# Patient Record
Sex: Female | Born: 1995 | Race: Black or African American | Hispanic: No | Marital: Single | Smoking: Never smoker
Health system: Southern US, Community
[De-identification: ages and names within clinical notes are randomized; demographics above are authoritative.]

## PROBLEM LIST (undated history)

## (undated) DIAGNOSIS — D649 Anemia, unspecified: Secondary | ICD-10-CM

## (undated) HISTORY — PX: NO PAST SURGERIES: SHX2092

---
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
## 2013-05-10 ENCOUNTER — Encounter: Payer: Self-pay | Admitting: Family Medicine

## 2013-05-10 ENCOUNTER — Other Ambulatory Visit (HOSPITAL_COMMUNITY)
Admission: RE | Admit: 2013-05-10 | Discharge: 2013-05-10 | Disposition: A | Discharge status: Home / Self Care | Payer: Medicaid Other | Source: Ambulatory Visit | Attending: Family Medicine | Admitting: Family Medicine

## 2013-05-10 ENCOUNTER — Ambulatory Visit (INDEPENDENT_AMBULATORY_CARE_PROVIDER_SITE_OTHER): Payer: Medicaid Other | Admitting: Family Medicine

## 2013-05-10 VITALS — BP 119/74 | HR 66 | Ht 67.0 in | Wt 110.0 lb

## 2013-05-10 DIAGNOSIS — Z0000 Encounter for general adult medical examination without abnormal findings: Secondary | ICD-10-CM

## 2013-05-10 DIAGNOSIS — Z309 Encounter for contraceptive management, unspecified: Secondary | ICD-10-CM

## 2013-05-10 DIAGNOSIS — IMO0001 Reserved for inherently not codable concepts without codable children: Secondary | ICD-10-CM

## 2013-05-10 DIAGNOSIS — Z113 Encounter for screening for infections with a predominantly sexual mode of transmission: Secondary | ICD-10-CM | POA: Insufficient documentation

## 2013-05-10 DIAGNOSIS — D509 Iron deficiency anemia, unspecified: Secondary | ICD-10-CM

## 2013-05-10 DIAGNOSIS — N72 Inflammatory disease of cervix uteri: Secondary | ICD-10-CM

## 2013-05-10 DIAGNOSIS — D649 Anemia, unspecified: Secondary | ICD-10-CM

## 2013-05-10 LAB — CBC
HCT: 41.9 % (ref 36.0–46.0)
MCV: 87.1 fL (ref 78.0–100.0)
RBC: 4.81 MIL/uL (ref 3.87–5.11)
RDW: 12.9 % (ref 11.5–15.5)
WBC: 4.3 10*3/uL (ref 4.0–10.5)

## 2013-05-10 NOTE — Progress Notes (Signed)
  Subjective:    Patient ID: Anne Vaughn, female    DOB: 1996-04-20, 17 y.o.   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  No past surgical history on file.     Review of Systems Intermittent vaginal discharge     Objective:   Physical Exam  Constitutional: She is oriented to person, place, and time. She appears well-developed and well-nourished. No distress.  HENT:  Head: Normocephalic and atraumatic.  Mouth/Throat: Oropharynx is clear and moist.  Eyes: Pupils are equal, round, and reactive to light.  Cardiovascular: Normal rate and regular rhythm.   Pulmonary/Chest: Effort normal.  Abdominal: Soft. Bowel sounds are normal.  Neurological: She is alert and oriented to person, place, and time.  Skin: Skin is warm and dry. She is not diaphoretic.  Psychiatric: She has a normal mood and affect. Her behavior is normal.    BP 119/74  Pulse 66  Ht 5\' 7"  (1.702 m)  Wt 110 lb (49.896 kg)  BMI 17.22 kg/m2  LMP 07/11/2012       Assessment & Plan:

## 2013-05-10 NOTE — Patient Instructions (Addendum)
Dear Anne Vaughn,   Congratulations on graduating. Please read below about the topics.   1. Anemia - I will check your blood today to see if you still have anemia  2. Birth Control - We will check a urine pregnancy test today. If it is negative, then you can take the pills. Use condoms until that time.   3. Infection Testing - Please follow up next week to go over the results.   I will see you next week.   Sincerely,   Dr. Clinton Sawyer

## 2013-05-11 DIAGNOSIS — Z0000 Encounter for general adult medical examination without abnormal findings: Secondary | ICD-10-CM | POA: Insufficient documentation

## 2013-05-11 NOTE — Assessment & Plan Note (Addendum)
Counseled about contraception. I think she would be better suited for IUD or Nexplanon, but she prefers pills. No history of clots. Will check pregnancy test today. If negative, then will start pills at next visit. Told to use condoms.

## 2013-05-11 NOTE — Assessment & Plan Note (Signed)
Given age and hx of sex with 5 partner, need to check for gonorrhea and chlamydia and HIV. Will get second HPV vaccine today. F/u in 1 week to go over results.

## 2013-05-11 NOTE — Assessment & Plan Note (Signed)
Not currently taking Fe. Will check CBC before re-initiating med.

## 2013-05-14 ENCOUNTER — Ambulatory Visit (INDEPENDENT_AMBULATORY_CARE_PROVIDER_SITE_OTHER): Payer: Medicaid Other | Admitting: Family Medicine

## 2013-05-14 ENCOUNTER — Telehealth: Payer: Self-pay | Admitting: Family Medicine

## 2013-05-14 ENCOUNTER — Encounter: Payer: Self-pay | Admitting: Family Medicine

## 2013-05-14 VITALS — BP 132/87 | HR 97 | Temp 99.0°F | Ht 67.0 in | Wt 109.2 lb

## 2013-05-14 DIAGNOSIS — L738 Other specified follicular disorders: Secondary | ICD-10-CM

## 2013-05-14 DIAGNOSIS — Z309 Encounter for contraceptive management, unspecified: Secondary | ICD-10-CM

## 2013-05-14 DIAGNOSIS — L853 Xerosis cutis: Secondary | ICD-10-CM

## 2013-05-14 DIAGNOSIS — Z3009 Encounter for other general counseling and advice on contraception: Secondary | ICD-10-CM

## 2013-05-14 DIAGNOSIS — Z23 Encounter for immunization: Secondary | ICD-10-CM

## 2013-05-14 DIAGNOSIS — Z00129 Encounter for routine child health examination without abnormal findings: Secondary | ICD-10-CM

## 2013-05-14 MED ORDER — NORGESTIMATE-ETH ESTRADIOL 0.25-35 MG-MCG PO TABS: 1.0000 | ORAL_TABLET | Freq: Every day | ORAL | Status: DC

## 2013-05-14 NOTE — Progress Notes (Signed)
  Subjective:    Patient ID: Anne Vaughn, female    DOB: December 19, 1995, 17 y.o.   MRN: [id number]  HPI  17 year old F who presents for contraception counseling and follow up of STD testing. She is also complaining of right foot itching  Contraception Counseling: Urine pregnancy test on 05/10/13 negative, and pt denies intercourse since that time; is still interested in OCP and not nexplanon or IUD; patient denies history of blood clot; non smoker   Right Foot Itching:  Location: right lateral foot Duration: weeks Alleviating: nothing, uses vaseline for moisturizer without improvement  Exacerbating: she thinks heat makes it worse Associated Symptoms: peeling of skin  Denies: redness, swelling,   Review of Systems     Objective:   Physical Exam BP 132/87  Pulse 97  Temp(Src) 99 F (37.2 C) (Oral)  Ht 5\' 7"  (1.702 m)  Wt 109 lb 3.2 oz (49.533 kg)  BMI 17.1 kg/m2  LMP 07/11/2012  Gen: non distressed, thin teenage female Skin: mild scaling of right plantar surface without rash      Assessment & Plan:

## 2013-05-14 NOTE — Telephone Encounter (Signed)
I left a message with the patient's mother stating that her test results were all normal.

## 2013-05-14 NOTE — Patient Instructions (Addendum)
Dear Anne Vaughn,   Thanks for coming in today.   Please use hydrocortisone cream on the right foot twice a day for 2 weeks. Continue to moisturize as well.   Regarding the birth control, start taking it today. Take it at the same time each day. Use condoms for the next 7 days until it become effective.   Sincerely,   Dr. Karilyn Cota Estradiol; Norgestimate tablets What is this medicine? ETHINYL ESTRADIOL; NORGESTIMATE (ETH in il es tra DYE ole; nor JES ti mate) is an oral contraceptive. The products combine two types of female hormones, an estrogen and a progestin. They are used to prevent ovulation and pregnancy. Some products are also used to treat acne in females. This medicine may be used for other purposes; ask your health care provider or pharmacist if you have questions. What should I tell my health care provider before I take this medicine? They need to know if you have or ever had any of these conditions: -abnormal vaginal bleeding -blood vessel disease or blood clots -breast, cervical, endometrial, ovarian, liver, or uterine cancer -diabetes -gallbladder disease -heart disease or recent heart attack -high blood pressure -high cholesterol -kidney disease -liver disease -migraine headaches -stroke -systemic lupus erythematosus (SLE) -tobacco smoker -an unusual or allergic reaction to estrogens, progestins, other medicines, foods, dyes, or preservatives -pregnant or trying to get pregnant -breast-feeding How should I use this medicine? Take this medicine by mouth. To reduce nausea, this medicine may be taken with food. Follow the directions on the prescription label. Take this medicine at the same time each day and in the order directed on the package. Do not take your medicine more often than directed. Contact your pediatrician regarding the use of this medicine in children. Special care may be needed. This medicine has been used in female children who have  started having menstrual periods. A patient package insert for the product will be given with each prescription and refill. Read this sheet carefully each time. The sheet may change frequently. Overdosage: If you think you have taken too much of this medicine contact a poison control center or emergency room at once. NOTE: This medicine is only for you. Do not share this medicine with others. What if I miss a dose? If you miss a dose, refer to the patient information sheet you received with your medicine for direction. If you miss more than one pill, this medicine may not be as effective and you may need to use another form of birth control. What may interact with this medicine? -acetaminophen -antibiotics or medicines for infections, especially rifampin, rifabutin, rifapentine, and griseofulvin, and possibly penicillins or tetracyclines -aprepitant -ascorbic acid (vitamin C) -atorvastatin -barbiturate medicines, such as phenobarbital -bosentan -carbamazepine -caffeine -clofibrate -cyclosporine -dantrolene -doxercalciferol -felbamate -grapefruit juice -hydrocortisone -medicines for anxiety or sleeping problems, such as diazepam or temazepam -medicines for diabetes, including pioglitazone -mineral oil -modafinil -mycophenolate -nefazodone -oxcarbazepine -phenytoin -prednisolone -ritonavir or other medicines for HIV infection or AIDS -rosuvastatin -selegiline -soy isoflavones supplements -St. John's wort -tamoxifen or raloxifene -theophylline -thyroid hormones -topiramate -warfarin This list may not describe all possible interactions. Give your health care provider a list of all the medicines, herbs, non-prescription drugs, or dietary supplements you use. Also tell them if you smoke, drink alcohol, or use illegal drugs. Some items may interact with your medicine. What should I watch for while using this medicine? Visit your doctor or health care professional for regular  checks on your progress. You will need a regular  breast and pelvic exam and Pap smear while on this medicine. You should also discuss the need for regular mammograms with your health care professional, and follow his or her guidelines for these tests. This medicine can make your body retain fluid, making your fingers, hands, or ankles swell. Your blood pressure can go up. Contact your doctor or health care professional if you feel you are retaining fluid. Use an additional method of contraception during the first cycle that you take these tablets. If you have any reason to think you are pregnant, stop taking this medicine right away and contact your doctor or health care professional. If you are taking this medicine for hormone related problems, it may take several cycles of use to see improvement in your condition. Smoking increases the risk of getting a blood clot or having a stroke while you are taking birth control pills, especially if you are more than 19 years old. You are strongly advised not to smoke. This medicine can make you more sensitive to the sun. Keep out of the sun. If you cannot avoid being in the sun, wear protective clothing and use sunscreen. Do not use sun lamps or tanning beds/booths. If you wear contact lenses and notice visual changes, or if the lenses begin to feel uncomfortable, consult your eye care specialist. In some women, tenderness, swelling, or minor bleeding of the gums may occur. Notify your dentist if this happens. Brushing and flossing your teeth regularly may help limit this. See your dentist regularly and inform your dentist of the medicines you are taking. If you are going to have elective surgery, you may need to stop taking this medicine before the surgery. Consult your health care professional for advice. This medicine does not protect you against HIV infection (AIDS) or any other sexually transmitted diseases. What side effects may I notice from receiving this  medicine? Side effects that you should report to your doctor or health care professional as soon as possible: -breast tissue changes or discharge -changes in vaginal bleeding during your period or between your periods -chest pain -coughing up blood -dizziness or fainting spells -headaches or migraines -leg, arm or groin pain -severe or sudden headaches -stomach pain (severe) -sudden shortness of breath -sudden loss of coordination, especially on one side of the body -speech problems -symptoms of vaginal infection like itching, irritation or unusual discharge -tenderness in the upper abdomen -vomiting -weakness or numbness in the arms or legs, especially on one side of the body -yellowing of the eyes or skin Side effects that usually do not require medical attention (report to your doctor or health care professional if they continue or are bothersome): -breakthrough bleeding and spotting that continues beyond the 3 initial cycles of pills -breast tenderness -mood changes, anxiety, depression, frustration, anger, or emotional outbursts -increased sensitivity to sun or ultraviolet light -nausea -skin rash, acne, or brown spots on the skin -weight gain (slight) This list may not describe all possible side effects. Call your doctor for medical advice about side effects. You may report side effects to FDA at 1-800-FDA-1088. Where should I keep my medicine? Keep out of the reach of children. Store at room temperature between 15 and 30 degrees C (59 and 86 degrees F). Throw away any unused medicine after the expiration date. NOTE: This sheet is a summary. It may not cover all possible information. If you have questions about this medicine, talk to your doctor, pharmacist, or health care provider.  2013, [employer]. (10/10/2008 1:40:47 PM)

## 2013-05-16 DIAGNOSIS — L853 Xerosis cutis: Secondary | ICD-10-CM | POA: Insufficient documentation

## 2013-05-16 NOTE — Assessment & Plan Note (Signed)
Appears that ithcing of right foot is due to mild inflammatory dermatitis. No evidence of infection. Instructed to use cortisone BID for 14 days. Return PRN.

## 2013-05-16 NOTE — Assessment & Plan Note (Signed)
Given negative pregnancy test and no history of DVT, patient given order for Sprintec. Instructed to use barrier contraception for 7 days and to take pill at same time each day. Information about medicine given in patient handout.

## 2013-09-14 ENCOUNTER — Ambulatory Visit: Payer: Medicaid Other

## 2013-09-28 ENCOUNTER — Encounter: Payer: Self-pay | Admitting: Family Medicine

## 2013-11-21 ENCOUNTER — Ambulatory Visit (INDEPENDENT_AMBULATORY_CARE_PROVIDER_SITE_OTHER): Payer: Medicaid Other | Admitting: Family Medicine

## 2013-11-21 VITALS — BP 130/73 | HR 76 | Wt 115.0 lb

## 2013-11-21 DIAGNOSIS — IMO0001 Reserved for inherently not codable concepts without codable children: Secondary | ICD-10-CM

## 2013-11-21 DIAGNOSIS — Z23 Encounter for immunization: Secondary | ICD-10-CM

## 2013-11-21 DIAGNOSIS — Z309 Encounter for contraceptive management, unspecified: Secondary | ICD-10-CM

## 2013-11-21 LAB — POCT URINE PREGNANCY: PREG TEST UR: NEGATIVE

## 2013-11-21 MED ORDER — NORGESTREL-ETHINYL ESTRADIOL 0.3-30 MG-MCG PO TABS: 1.0000 | ORAL_TABLET | Freq: Every day | ORAL | Status: DC

## 2013-11-21 NOTE — Patient Instructions (Signed)
Dear Anne Vaughn,   It was nice to see you. I sent a prescription to the pharmacy for a new type of birth control. It is very similar to the other but may have less side effects. I do not know the price. If it is too expensive, then we will have to go back to sprintec or think about an IUD or nexplanon.   Take Care,   Dr. Clinton SawyerWilliamson

## 2013-11-21 NOTE — Progress Notes (Signed)
   Subjective:    Patient ID: Anne Vaughn, female    DOB: 10-Nov-1995, 18 y.o.   MRN: [id number]  HPI 18 year old F who presents for contraception mgt. Last seen 05/14/13 and placed on sprintec OCP. She stopped it due to irritability and headache. LMP 10/28/13. Pt does not want nexplanon, IUD or depo-provera injection.    She would also like her 3rd shot in HPV series.   PMH - no hx DVT, PE  Review of Systems     Objective:   Physical Exam BP 130/73  Pulse 76  Wt 115 lb (52.164 kg)  Gen: well appearing, non distressed  Negative pregnancy test     Assessment & Plan:

## 2013-11-22 NOTE — Assessment & Plan Note (Signed)
A: pt with mild symptoms but very bothered by them P: give Rx for another monophasic estrogen-progesterone combo pill

## 2014-08-13 ENCOUNTER — Ambulatory Visit: Payer: Medicaid Other | Admitting: Family Medicine

## 2014-11-08 NOTE — L&D Delivery Note (Cosign Needed)
Delivery Note Pt pushed well and at 12:22 AM a viable female was delivered via Vaginal, Spontaneous Delivery (Presentation: Right Occiput Anterior).  APGAR: 9, 10; weight: pending. Infant lifted to lower portion of pt's abd due to short cord, stimulated and dried. Cord clamped and cut by FOB; hospital cord blood sample collected. Placenta status: Intact, Spontaneous.  Cord: 3 vessels and noted to be subjectively short approx 12" in length  Anesthesia: Epidural  Episiotomy: None Lacerations:  none Est. Blood Loss (mL): 200  Mom to postpartum.  Baby to Couplet care / Skin to Skin.  Cam Hai CNM 08/09/2015, 12:45 AM

## 2014-12-10 ENCOUNTER — Encounter: Payer: Self-pay | Admitting: Family Medicine

## 2014-12-10 ENCOUNTER — Ambulatory Visit (INDEPENDENT_AMBULATORY_CARE_PROVIDER_SITE_OTHER): Payer: Medicaid Other | Admitting: Family Medicine

## 2014-12-10 VITALS — BP 137/72 | HR 107 | Temp 98.7°F | Ht 67.5 in | Wt 111.7 lb

## 2014-12-10 DIAGNOSIS — Z3200 Encounter for pregnancy test, result unknown: Secondary | ICD-10-CM

## 2014-12-10 DIAGNOSIS — Z3400 Encounter for supervision of normal first pregnancy, unspecified trimester: Secondary | ICD-10-CM | POA: Insufficient documentation

## 2014-12-10 DIAGNOSIS — Z3401 Encounter for supervision of normal first pregnancy, first trimester: Secondary | ICD-10-CM

## 2014-12-10 DIAGNOSIS — N912 Amenorrhea, unspecified: Secondary | ICD-10-CM

## 2014-12-10 LAB — POCT URINE PREGNANCY: Preg Test, Ur: POSITIVE

## 2014-12-10 NOTE — Patient Instructions (Signed)
Prenatal Vitamin and Mineral Combinations (oral solid dosage forms) What is this medicine? PRENATAL VITAMIN AND MINERAL combinations are used before, during, and after pregnancy to help provide provide good nutrition. This medicine may be used for other purposes; ask your health care provider or pharmacist if you have questions. COMMON BRAND NAME(S): Active OB, Advanced Care Plus, Advanced NatalCare, Advanced-RF NatalCare, Aminate Fe, Anemagen OB, B-Nexa, BP FoliNatal Plus B, BP MultiNatal Plus, BP MultiNatal Plus Chewable, BP Prenate, Brainstrong, Bright Beginnings Prenatal, Cal-Nate, Calcium PNV, CareNatal DHA, CareNate 600, Cavan One Omega, Cavan Prenatal with EC Calcium, Cavan-Alpha, Cavan-EC SOD DHA, Cavan-Heme OB, Cavan-Heme Omega, Cenogen Ultra, Centrum Specialist Prenatal, CertaVite with Antioxidants, Choice-OB + DHA, Citracal Prenatal, Citracal Prenatal + DHA, CitraNatal 90 DHA, CitraNatal Assure, CitraNatal B-Calm, CitraNatal DHA, CitraNatal Harmony, CitraNatal Rx, Classic Prenatal, ComBi Rx, Complete Natal DHA, Complete-RF, CompleteNate, Concept DHA, Concept OB, CoreNate-DHA, Corvite FE with Quatrefolic, CRNatal DHA, Daily Vitamin, Docosavit, Duet, Duet Chewable, Duet DHA, Duet DHA 400, Duet DHA 430ec, Duet DHA Balanced, Duet DHA Complete, Duet DHA Complete Gluten Free, Duet DHA EC, Duet DHA Ferrazone, EC Omega-3, DuoVit DHA, Edge OB, Elite OB, Elite OB with DHA, Elite-OB 400, Extra-Virt Plus, Femecal OB, Femecal OB Plus DHA, Ferrocite Plus, Folbecal, Folcal DHA, Folcaps Care One, Folcaps Omega 3, Folet One with Quatrefolic, Folivane OB, Folivane-EC Calcium DHA NF, Foltabs 90 Plus DHA, Foltabs Prenatal, Foltabs Prenatal Plus DHA, Gesticare, Gesticare DHA, Gesticare DHA Delayed-Release, HemeNatal OB, HemeNatal OB + DHA, Hemocyte Plus, HIP Prenatal, ICAR Prenatal Rx, iNatal Advance, iNatal GT, iNatal Ultra, Infanate Balance, Infanate DHA, Infanate Plus, Kolnatal, Lactocal-F, Levomefolate PNV, MACNATAL  CN DHA, Marnatal-F, Marnatal-F Plus, Materna, Maxinate, Mission Prenatal, Mission Prenatal F.A., Mission Prenatal H.P., Mom's Choice Rx, Multi-Nate 30, Multi-Nate 30 DHA, Multi-Nate DHA Extra, Multifol Plus, Multivitamin With Minerals, MyNatal OB Prenatal, NataCaps, NataChew, NataFolic-OB, Natafort, Natal-V RX, NatalCare CFe 60, NatalCare GlossTabs, NatalCare PIC, NatalCare PIC Forte, NatalCare Plus, NatalCare Rx, NatalCare Three, NATALVIRT 90 DHA, NATALVIRT CA, NataTab CFe, NataTab FA, Natatab Rx, Natelle, Natelle C, Natelle One, Natelle Plus with DHA, Natelle Prefer, Natelle-ez, Navatab + DHA, Neevo, Neevo DHA, Nestabs, Nestabs ABC, Nestabs CBF, Nestabs DHA, Nestabs FA, Nestabs Rx, New Advanced Formula Prenatal Z, Nexa Plus, Nexa Select, Niferex-PN, Niferex-PN Forte, NovaNatal, NovaStart, Nu-Natal, NutraCare, Nutri-Tab OB, Nutri-Tab OB + DHA, Nutrinate, NutriSpire, O-Cal F.A., O-Cal Prenatal, OB Choice, OB Complete, OB Complete 400, OB Complete One, OB Complete Petite, OB Complete Premier, OB Complete with DHA, OB-Natal One, Obstetrix-100, Obtrex, Obtrex DHA, One-A-Day Women's, One-A-Day Women's Prenatal, OptiNate, Paire OB Tablet Plus DHA, PNV OB + DHA, PNV Prenatal, PNV Prenatal Plus Multivitamin, PNV Tabs 29-1, PNV-DHA, PNV-DHA + Docusate, PNV-DHA Plus, PNV-First, PNV-Iron, PNV-OB with DHA, PNV-Omega, PNV-Select, PNV-Total with DHA, PNV-VP-U, PR Natal 400, PR Natal 400ec, PR Natal 430, PR Natal 430ec, PR Natal 440ec, PreCare, PreferaOB, PreferaOB + DHA, PreferaOB One, Premesis Rx, Prena1 PEARL, Prena1 Plus, PrenaCare, Prenafirst, Prenaissance, Prenaissance 90 DHA, Prenaissance Balance, Prenaissance DHA, Prenaissance Harmony DHA, Prenaissance Next, Prenaissance Plus, Prenaissance Promise, PrenaPlus, Prenat with Quatrefolic, PreNata Multivitamin with Iron, Prenatabs CBF, Prenatabs FA, Prenatabs OBN, Prenatabs RX, Prenatal, Prenatal 1 Plus 1, Prenatal 19, Prenatal AD, Prenatal Formula 3, Prenatal H, Prenatal Low  Iron, Prenatal MR 90 Fe, Prenatal MTR with Selenium, Prenatal Multivitamin + DHA 2, Prenatal Optima Advance, Prenatal Plus, Prenatal Plus Iron, Prenatal Plus Low Iron, Prenatal Rx with Beta Carotene, Prenatal S, Prenatal U, Prenatal Vitamin, PreNatal Vitamins Plus, Prenate Advance, Prenate AM with Quatrefolic, Prenate DHA, Prenate Elite, Prenate   Enhance with Quatrefolic, Prenate Essential, Prenate GT, Prenate Mini, PreNate Plus, Prenate Restore with Quatrefolic, Prenate Star, Prenate Ultra, Prenavite, Prenavite Protein, PreNexa, PreNexa Premier, PrePLUS, PreQue, PreTAB, Previte Rx, PrimaCare, PrimaCare Advantage, PrimaCare ONE, Provida OB, PruEt DHA, PruEt DHAec, PureFe OB Plus, PureFe Plus, PureVit DualFe Plus, RE DualVit OB, RE DualVit Plus, RE OB + DHA, RE OB 90 + DHA, RE Prenatal, RE PreVit + DHA, RE-Nata 29, RE-Nata 29 OB, Reaphrim, Renate, Renate DHA, Renate DHA Extra, REocyte Plus, Right Step, Rovin-Nv, Rovin-Nv DHA, Se-Care, Se-Care Conceive, Se-Care Gesture, Se-Natal 19, Se-Natal 19 Chewable, Se-Natal 90, Se-Natal ONE, Se-Plete DHA, Se-Tan DHA, Se-Tan Plus, Select-OB, Select-OB + DHA, SetonET, SetonET-EC DHA, StrongStart, StrongStart Chewable Tablet, Stuart One, Stuart Prenatal, Stuart Prenatal + DHA, Stuartnatal Plus 3, Tandem DHA, Tandem OB, Tandem Plus, Taron A Prenatal Pack with DHA, Taron EC Calcium DHA Pack, Taron Prenatal with DHA, Taron-C DHA, Taron-EC Cal, Taron-Prex Prenatal with DHA, Thera Natal Complete, Thera Natal Core Nutrition, Thera Natal Lactation Support, Thera Natal OvaVite, Thera Natal Plus, Thera-Tabs, TL-Care DHA, TL-Select, TL-Select DHA, Tri Rx, TriAdvance, TriCare, TriCare Prenatal DHA ONE, Trifera OB, Trimesis Rx, Trinatal GT, Trinatal Rx 1, Trinate, Triveen-Duo DHA, Triveen-PRx RNF, Triveen-Ten, Trust Natal DHA, UltimateCare Advantage, UltimateCare Combo, UltimateCare ONE, UltimateCare ONE NF, Ultra NatalCare, VemaVite-PRx 2, Vena-Bal DHA, Venatal-FA, Verotin-BY, Verotin-GR,  Vinacal, Vinacal B, Vinatal Forte, Vinate 90, Vinate AZ, Vinate AZ Extra, Vinate C, Vinate Calcium, Vinate Care, Vinate DHA, Vinate GT, Vinate IC, Vinate II, Vinate III, Vinate M Low Iron, Vinate One, Vinate PN, Vinate Ultra, Virt Nate, Virt-Advance, Virt-C DHA, Virt-Care One, Virt-PN, Virt-PN DHA, Virt-PN Plus, Virt-Select, Virt-Vite GT, VirtPrex, Vitafol PN, Vitafol Ultra, Vitafol-Nano Prenatal, Vitafol-OB, Vitafol-OB + DHA, Vitafol-OB and DHA, Vitafol-One, VitaMed MD Plus Rx, VitaNatal OB Plus DHA, vitaPearl Prenatal, VitaPhil, VitaPhil + DHA, VitaPhil + DHA 90, VitaPhil AiDE, VitaSpire, Viva CT, VIVA DHA, Vol-Tab Rx, VP CH Ultra, VP-CH-PNV, VP-GGR-B6 Prenatal, VP-HEME OB, VP-HEME OB+ DHA, VP-PNV-DHA, Zatean-CH, Zatean-Pn, Zatean-Pn DHA, Zatean-Pn Plus, Zingiber What should I tell my health care provider before I take this medicine? They need to know if you have any of these conditions: -bleeding or clotting disorder -history of anemia of any type -other chronic health condition -an unusual or allergic reaction to vitamins, minerals, other medicines, foods, dyes, or preservatives How should I use this medicine? Take this medicine by mouth with a glass of water. You can take it with or without food. If it upsets your stomach, take it with food. Chewable prenatal vitamin tablets may be chewed completely before swallowing. Follow the directions on the prescription label. The usual dose is taken once a day. Do not take your medicine more often than directed. Contact your pediatrician regarding the use of this medicine in children. Special care may be needed. This medicine is intended for females who are pregnant, breast-feeding, or may become pregnant. Overdosage: If you think you have taken too much of this medicine contact a poison control center or emergency room at once. NOTE: This medicine is only for you. Do not share this medicine with others. What if I miss a dose? If you miss a dose, take it as  soon as you can. If it is almost time for your next dose, take only that dose. Do not take double or extra doses. What may interact with this medicine? -alendronate -antacids -cefdinir -cefditoren -etidronate -fluoroquinolone antibiotics (examples: ciprofloxacin, gatifloxacin, levofloxacin) -ibandronate -levodopa -risedronate -tetracycline antibiotics (examples: doxycycline, minocycline, tetracycline) -thyroid hormones -warfarin This list may not describe   all possible interactions. Give your health care provider a list of all the medicines, herbs, non-prescription drugs, or dietary supplements you use. Also tell them if you smoke, drink alcohol, or use illegal drugs. Some items may interact with your medicine. What should I watch for while using this medicine? See your health care professional for regular checks on your progress. Remember that vitamin and mineral supplements do not replace the need for good nutrition from a balanced diet. Stools commonly change color when vitamins and minerals are taken. Notify your health care professional if this change is alarming or accompanied by other symptoms, like abdominal pain. What side effects may I notice from receiving this medicine? Side effects that you should report to your doctor or health care professional as soon as possible: -allergic reaction such as skin rash or difficulty breathing -vomiting Side effects that usually do not require medical attention (report to your doctor or health care professional if they continue or are bothersome): -nausea -stomach upset This list may not describe all possible side effects. Call your doctor for medical advice about side effects. You may report side effects to FDA at 1-800-FDA-1088. Where should I keep my medicine? Keep out of the reach of children. Most vitamins and minerals should be stored at controlled room temperature. Check your specific product directions. Protect from heat and moisture.  Throw away any unused medicine after the expiration date. NOTE: This sheet is a summary. It may not cover all possible information. If you have questions about this medicine, talk to your doctor, pharmacist, or health care provider.  2015, Elsevier/Gold Standard. (2014-02-19 08:34:45)  

## 2014-12-10 NOTE — Progress Notes (Signed)
  Subjective:    Anne Vaughn is a 19 y.o. female who presents for evaluation of amenorrhea. She believes she could be pregnant. Pregnancy is not desired. Sexual Activity: multiple partners, contraception: condoms. Current symptoms also include: + pregnancy at HD. Last period was normal.  Pt states she was previously on Depo up until about 2 years ago and then went on OCP to help regulate her period which she stopped about 1.5 years ago.  She has used condoms in the meantime and this is an undesired pregnancy.  She states she was monogamous up until the middle of January, when she had sex with another partner, the condom broke, and she is unsure whose baby this could be (preivous long term partner vs one night encounter).  She would like to keep the baby and is not interested in abortion.  Denies alcohol or tobacco abuse.     Patient's last menstrual period was 11/08/2014 (exact date). The following portions of the patient's history were reviewed and updated as appropriate: allergies, current medications, past family history, past medical history, past social history, past surgical history and problem list.  Review of Systems Pertinent items are noted in HPI.     Objective:    BP 137/72 mmHg  Pulse 107  Temp(Src) 98.7 F (37.1 C) (Oral)  Ht 5' 7.5" (1.715 m)  Wt 111 lb 11.2 oz (50.667 kg)  BMI 17.23 kg/m2  LMP 11/08/2014 (Exact Date) General: alert, cooperative, appears stated age and no acute distress    Lab Review Urine HCG: positive    Assessment:    Absence of menstruation.     Plan:    Pregnancy Test: Positive: EDC: 11/08/14. Briefly discussed pre-natal care options. Pregnancy, Childbirth and the Newborn book given. Encouraged well-balanced diet, plenty of rest when needed, pre-natal vitamins daily and walking for exercise. Discussed self-help for nausea, avoiding OTC medications until consulting provider or pharmacist, other than Tylenol as needed, minimal caffeine (1-2  cups daily) and avoiding alcohol. She will schedule her initial OB visit in the next month with her PCP or OB provider. Feel free to call with any questions.

## 2014-12-27 ENCOUNTER — Other Ambulatory Visit: Payer: Medicaid Other

## 2014-12-27 DIAGNOSIS — Z3401 Encounter for supervision of normal first pregnancy, first trimester: Secondary | ICD-10-CM

## 2014-12-27 LAB — OB RESULTS CONSOLE GBS: GBS: POSITIVE

## 2014-12-27 NOTE — Progress Notes (Signed)
New OB labs, drawn by [employer] employee

## 2014-12-28 LAB — SICKLE CELL SCREEN: SICKLE CELL SCREEN: NEGATIVE

## 2014-12-28 LAB — HIV ANTIBODY (ROUTINE TESTING W REFLEX): HIV: NONREACTIVE

## 2014-12-29 LAB — CULTURE, OB URINE: Colony Count: >=100000

## 2014-12-30 LAB — OBSTETRIC PANEL
Antibody Screen: NEGATIVE
BASOS PCT: 0 % (ref 0–1)
Basophils Absolute: 0 10*3/uL (ref 0.0–0.1)
Eosinophils Absolute: 0 10*3/uL (ref 0.0–0.7)
Eosinophils Relative: 1 % (ref 0–5)
HEMATOCRIT: 40.4 % (ref 36.0–46.0)
HEMOGLOBIN: 13.5 g/dL (ref 12.0–15.0)
HEP B S AG: NEGATIVE
LYMPHS PCT: 39 % (ref 12–46)
Lymphs Abs: 1.9 10*3/uL (ref 0.7–4.0)
MCH: 27.7 pg (ref 26.0–34.0)
MCHC: 33.4 g/dL (ref 30.0–36.0)
MCV: 83 fL (ref 78.0–100.0)
MONOS PCT: 11 % (ref 3–12)
MPV: 9 fL (ref 8.6–12.4)
Monocytes Absolute: 0.5 10*3/uL (ref 0.1–1.0)
NEUTROS ABS: 2.4 10*3/uL (ref 1.7–7.7)
NEUTROS PCT: 49 % (ref 43–77)
Platelets: 308 10*3/uL (ref 150–400)
RBC: 4.87 MIL/uL (ref 3.87–5.11)
RDW: 17 % — AB (ref 11.5–15.5)
RH TYPE: POSITIVE
RUBELLA: 3.17 {index} — AB (ref <0.90)
WBC: 4.8 10*3/uL (ref 4.0–10.5)

## 2015-01-01 ENCOUNTER — Telehealth: Payer: Self-pay | Admitting: Family Medicine

## 2015-01-01 DIAGNOSIS — R8271 Bacteriuria: Secondary | ICD-10-CM

## 2015-01-01 MED ORDER — AMPICILLIN 500 MG PO CAPS
500.0000 mg | ORAL_CAPSULE | Freq: Four times a day (QID) | ORAL | Status: DC
Start: 2015-01-01 — End: 2015-08-08

## 2015-01-01 NOTE — Telephone Encounter (Signed)
Spoke with patient about lab results. She has asymptomatic bacteruria so will treat with ampicillin for 5 days.   Myra RudeJeremy E Schmitz, MD PGY-2, [facility] 01/01/2015, 9:13 AM

## 2015-01-03 ENCOUNTER — Encounter: Payer: Self-pay | Admitting: Family Medicine

## 2015-01-03 ENCOUNTER — Ambulatory Visit (INDEPENDENT_AMBULATORY_CARE_PROVIDER_SITE_OTHER): Payer: Medicaid Other | Admitting: Family Medicine

## 2015-01-03 VITALS — BP 133/79 | HR 81 | Temp 98.4°F | Wt 113.0 lb

## 2015-01-03 DIAGNOSIS — Z3401 Encounter for supervision of normal first pregnancy, first trimester: Secondary | ICD-10-CM

## 2015-01-03 NOTE — Assessment & Plan Note (Signed)
No concerns at this moment. Reviewed indications for going to [facility].  - f/u in 4 weeks.  - she would like the integrated screen  - given list of safe medications in pregnancy  - GBS + urine culture, make sure she completed ABX course

## 2015-01-03 NOTE — Patient Instructions (Signed)
Thank you for coming in,   Follow up with me in 4 weeks.   Please take the antibiotics to completion.    Please feel free to call with any questions or concerns at any time, at [phone]. --Dr. Jordan Likes Prenatal Care  WHAT IS PRENATAL CARE?  Prenatal care means health care during your pregnancy, before your baby is born. It is very important to take care of yourself and your baby during your pregnancy by:   Getting early prenatal care. If you know you are pregnant, or think you might be pregnant, call your health care provider as soon as possible. Schedule a visit for a prenatal exam.  Getting regular prenatal care. Follow your health care provider's schedule for blood and other necessary tests. Do not miss appointments.  Doing everything you can to keep yourself and your baby healthy during your pregnancy.  Getting complete care. Prenatal care should include evaluation of the medical, dietary, educational, psychological, and social needs of you and your significant other. The medical and genetic history of your family and the family of your baby's father should be discussed with your health care provider.  Discussing with your health care provider:  Prescription, over-the-counter, and herbal medicines that you take.  Any history of substance abuse, alcohol use, smoking, and illegal drug use.  Any history of domestic abuse and violence.  Immunizations you have received.  Your nutrition and diet.  The amount of exercise you do.  Any environmental and occupational hazards to which you are exposed.  History of sexually transmitted infections for both you and your partner.  Previous pregnancies you have had. WHY IS PRENATAL CARE SO IMPORTANT?  By regularly seeing your health care provider, you help ensure that problems can be identified early so that they can be treated as soon as possible. Other problems might be prevented. Many studies have shown that early and regular prenatal  care is important for the health of mothers and their babies.  HOW CAN I TAKE CARE OF MYSELF WHILE I AM PREGNANT?  Here are ways to take care of yourself and your baby:   Start or continue taking your multivitamin with 400 micrograms (mcg) of folic acid every day.  Get early and regular prenatal care. It is very important to see a health care provider during your pregnancy. Your health care provider will check at each visit to make sure that you and your baby are healthy. If there are any problems, action can be taken right away to help you and your baby.  Eat a healthy diet that includes:  Fruits.  Vegetables.  Foods low in saturated fat.  Whole grains.  Calcium-rich foods, such as milk, yogurt, and hard cheeses.  Drink 6-8 glasses of liquids a day.  Unless your health care provider tells you not to, try to be physically active for 30 minutes, most days of the week. If you are pressed for time, you can get your activity in through 10-minute segments, three times a day.  Do not smoke, drink alcohol, or use drugs. These can cause long-term damage to your baby. Talk with your health care provider about steps to take to stop smoking. Talk with a member of your faith community, a counselor, a trusted friend, or your health care provider if you are concerned about your alcohol or drug use.  Ask your health care provider before taking any medicine, even over-the-counter medicines. Some medicines are not safe to take during pregnancy.  Get plenty of rest  and sleep.  Avoid hot tubs and saunas during pregnancy.  Do not have X-rays taken unless absolutely necessary and with the recommendation of your health care provider. A lead shield can be placed on your abdomen to protect your baby when X-rays are taken in other parts of your body.  Do not empty the cat litter when you are pregnant. It may contain a parasite that causes an infection called toxoplasmosis, which can cause birth defects.  Also, use gloves when working in garden areas used by cats.  Do not eat uncooked or undercooked meats or fish.  Do not eat soft, mold-ripened cheeses (Brie, Camembert, and chevre) or soft, blue-veined cheese (Danish blue and Roquefort).  Stay away from toxic chemicals like:  Insecticides.  Solvents (some cleaners or paint thinners).  Lead.  Mercury.  Sexual intercourse may continue until the end of the pregnancy, unless you have a medical problem or there is a problem with the pregnancy and your health care provider tells you not to.  Do not wear high-heel shoes, especially during the second half of the pregnancy. You can lose your balance and fall.  Do not take long trips, unless absolutely necessary. Be sure to see your health care provider before going on the trip.  Do not sit in one position for more than 2 hours when on a trip.  Take a copy of your medical records when going on a trip. Know where a hospital is located in the city you are visiting, in case of an emergency.  Most dangerous household products will have pregnancy warnings on their labels. Ask your health care provider about products if you are unsure.  Limit or eliminate your caffeine intake from coffee, tea, sodas, medicines, and chocolate.  Many women continue working through pregnancy. Staying active might help you stay healthier. If you have a question about the safety or the hours you work at your particular job, talk with your health care provider.  Get informed:  Read books.  Watch videos.  Go to childbirth classes for you and your significant other.  Talk with experienced moms.  Ask your health care provider about childbirth education classes for you and your partner. Classes can help you and your partner prepare for the birth of your baby.  Ask about a baby doctor (pediatrician) and methods and pain medicine for labor, delivery, and possible cesarean delivery. HOW OFTEN SHOULD I SEE MY HEALTH  CARE PROVIDER DURING PREGNANCY?  Your health care provider will give you a schedule for your prenatal visits. You will have visits more often as you get closer to the end of your pregnancy. An average pregnancy lasts about 40 weeks.  A typical schedule includes visiting your health care provider:   About once each month during your first 6 months of pregnancy.  Every 2 weeks during the next 2 months.  Weekly in the last month, until the delivery date. Your health care provider will probably want to see you more often if:  You are older than 35 years.  Your pregnancy is high risk because you have certain health problems or problems with the pregnancy, such as:  Diabetes.  High blood pressure.  The baby is not growing on schedule, according to the dates of the pregnancy. Your health care provider will do special tests to make sure you and your baby are not having any serious problems. WHAT HAPPENS DURING PRENATAL VISITS?   At your first prenatal visit, your health care provider will do a  physical exam and talk to you about your health history and the health history of your partner and your family. Your health care provider will be able to tell you what date to expect your baby to be born on.  Your first physical exam will include checks of your blood pressure, measurements of your height and weight, and an exam of your pelvic organs. Your health care provider will do a Pap test if you have not had one recently and will do cultures of your cervix to make sure there is no infection.  At each prenatal visit, there will be tests of your blood, urine, blood pressure, weight, and the progress of the baby will be checked.  At your later prenatal visits, your health care provider will check how you are doing and how your baby is developing. You may have a number of tests done as your pregnancy progresses.  Ultrasound exams are often used to check on your baby's growth and health.  You may have  more urine and blood tests, as well as special tests, if needed. These may include amniocentesis to examine fluid in the pregnancy sac, stress tests to check how the baby responds to contractions, or a biophysical profile to measure your baby's well-being. Your health care provider will explain the tests and why they are necessary.  You should be tested for high blood sugar (gestational diabetes) between the 24th and 28th weeks of your pregnancy.  You should discuss with your health care provider your plans to breastfeed or bottle-feed your baby.  Each visit is also a chance for you to learn about staying healthy during pregnancy and to ask questions. Document Released: 10/28/2003 Document Revised: 10/30/2013 Document Reviewed: 01/09/2014 [facility] Patient Information 2015 [place], [place]. This information is not intended to replace advice given to you by your health care provider. Make sure you discuss any questions you have with your health care provider.

## 2015-01-03 NOTE — Progress Notes (Signed)
Anne Vaughn is a 19 y.o. yo G1P0 at Unknown who presents for her initial prenatal visit. Pregnancy  is notplanned She reports nausea. She  isTaking PNV. See flow sheet for details.  PMH, POBH, FH, meds, allergies and Social Hx reviewed.  Prenatal exam:Gen: Well nourished, well developed.  No distress.  Vitals noted. HEENT: Normocephalic, atraumatic.  Neck supple without cervical lymphadenopathy, thyromegaly or thyroid nodules.  fair dentition. CV: RRR no murmur, gallops or rubs Lungs: CTA B.  Normal respiratory effort without wheezes or rales. Abd: soft, NTND. +BS.  Uterus not appreciated above pelvis. Ext: No clubbing, cyanosis or edema. Psych: Normal grooming and dress.  Not depressed or anxious appearing.  Normal thought content and process without flight of ideas or looseness of associations    Assessment/plan: 1) Pregnancy G1P0 8w, doing well.  Current pregnancy issues include: none  Dating is reliable Prenatal labs reviewed, notable for GBS + urine culture. Bleeding and pain precautions reviewed. Importance of prenatal vitamins reviewed.  Genetic screening offered.  Early glucola is not indicated.    Follow up 4 weeks.

## 2015-02-05 ENCOUNTER — Encounter: Payer: Medicaid Other | Admitting: Advanced Practice Midwife

## 2015-02-18 ENCOUNTER — Telehealth: Payer: Self-pay | Admitting: Family Medicine

## 2015-02-18 ENCOUNTER — Encounter: Payer: Self-pay | Admitting: Obstetrics & Gynecology

## 2015-02-18 ENCOUNTER — Ambulatory Visit (INDEPENDENT_AMBULATORY_CARE_PROVIDER_SITE_OTHER): Payer: Medicaid Other | Admitting: Obstetrics & Gynecology

## 2015-02-18 VITALS — BP 123/68 | HR 79 | Temp 97.8°F | Wt 107.9 lb

## 2015-02-18 DIAGNOSIS — Z3402 Encounter for supervision of normal first pregnancy, second trimester: Secondary | ICD-10-CM

## 2015-02-18 LAB — POCT URINALYSIS DIP (DEVICE)
Bilirubin Urine: NEGATIVE
GLUCOSE, UA: NEGATIVE mg/dL
HGB URINE DIPSTICK: NEGATIVE
Leukocytes, UA: NEGATIVE
Nitrite: NEGATIVE
PH: 6 (ref 5.0–8.0)
Protein, ur: 30 mg/dL — AB
SPECIFIC GRAVITY, URINE: 1.025 (ref 1.005–1.030)
UROBILINOGEN UA: 0.2 mg/dL (ref 0.0–1.0)

## 2015-02-18 NOTE — Telephone Encounter (Signed)
[facility] Please advise

## 2015-02-18 NOTE — Patient Instructions (Signed)
Return to clinic for any obstetric concerns or go to [facility] for evaluation  

## 2015-02-18 NOTE — Progress Notes (Signed)
Referral from [facility] by patient's request New ob packet given  Declined flu vaccine US for anatomy scheduled for May 10 @ 0830

## 2015-02-18 NOTE — Telephone Encounter (Signed)
Spoke with patietn and she is wanting to come back here due to the fact that [facility], but is the same as here. patient twill call to set up OB appontment

## 2015-02-18 NOTE — Progress Notes (Signed)
Referral from [facility] by patient's request New ob packet given  Declined flu vaccine.  Quad screen drawn today. Anatomy scan ordered and scheduled for patient. The nature of [facility] with multiple MDs and other Advanced Practice Providers was explained to patient; also emphasized that residents, students are part of our team. Patient did not know that we Midwife(Faculty Practice) also are in [facility] and provide all in-hospital/[facility] services to [facility] patients.  When informed of this, [facility]. No other complaints or concerns.  Routine obstetric precautions reviewed.  She will call them to make appointment in 4 weeks.

## 2015-02-19 LAB — GC/CHLAMYDIA PROBE AMP
CT Probe RNA: NEGATIVE
GC Probe RNA: NEGATIVE

## 2015-02-21 LAB — AFP, QUAD SCREEN
AFP: 56 ng/mL
Age Alone: 1:1170 {titer}
Curr Gest Age: 14.4 wks.days
Down Syndrome Scr Risk Est: <1:38500 {titer}
HCG, Total: 87.21 IU/mL
INH: 283 pg/mL
Interpretation-AFP: NEGATIVE
MOM FOR AFP: 1.5
MOM FOR HCG: 1.11
MoM for INH: 1.32
Open Spina bifida: NEGATIVE
Osb Risk: 1:5600 {titer}
TRI 18 SCR RISK EST: NEGATIVE
uE3 Mom: 1.14
uE3 Value: 0.68 ng/mL

## 2015-03-18 ENCOUNTER — Ambulatory Visit (HOSPITAL_COMMUNITY): Payer: Medicaid Other

## 2015-03-18 ENCOUNTER — Ambulatory Visit (HOSPITAL_COMMUNITY)
Admission: RE | Admit: 2015-03-18 | Discharge: 2015-03-18 | Disposition: A | Discharge status: Home / Self Care | Payer: Medicaid Other | Source: Ambulatory Visit | Attending: Obstetrics & Gynecology | Admitting: Obstetrics & Gynecology

## 2015-03-18 DIAGNOSIS — Z3A18 18 weeks gestation of pregnancy: Secondary | ICD-10-CM | POA: Diagnosis not present

## 2015-03-18 DIAGNOSIS — O0932 Supervision of pregnancy with insufficient antenatal care, second trimester: Secondary | ICD-10-CM | POA: Insufficient documentation

## 2015-03-18 DIAGNOSIS — Z36 Encounter for antenatal screening of mother: Secondary | ICD-10-CM | POA: Insufficient documentation

## 2015-03-18 DIAGNOSIS — Z3689 Encounter for other specified antenatal screening: Secondary | ICD-10-CM | POA: Insufficient documentation

## 2015-03-18 DIAGNOSIS — Z3402 Encounter for supervision of normal first pregnancy, second trimester: Secondary | ICD-10-CM

## 2015-03-24 ENCOUNTER — Ambulatory Visit (INDEPENDENT_AMBULATORY_CARE_PROVIDER_SITE_OTHER): Payer: Medicaid Other | Admitting: Family Medicine

## 2015-03-24 VITALS — BP 116/55 | HR 76 | Temp 98.4°F | Wt 108.0 lb

## 2015-03-24 DIAGNOSIS — Z331 Pregnant state, incidental: Secondary | ICD-10-CM

## 2015-03-24 DIAGNOSIS — Z3492 Encounter for supervision of normal pregnancy, unspecified, second trimester: Secondary | ICD-10-CM

## 2015-03-24 NOTE — Progress Notes (Signed)
Anne AbideShaqueena Anne Vaughn is a 19 y.o. G1P0 at 8479w3d for routine follow up.  She reports the baby is kicking now.  No VB and leakage of fluid.  See flow sheet for details.  24-hr recalll:  B ( AM)- toast   Snk ( AM)- chips/cookies   L ( PM)- Fish sticks  Snk ( PM)- chips/cookies  D ( PM)- pasta, chicken, cream of corn   A/P: Pregnancy at 5479w3d.  Doing well.   Pregnancy issues include none  Anatomy scan reviewed, problems are noted. Getting repeat around 22 weeks.   Preterm labor precautions reviewed. Follow up 4 weeks. Has lost some weight over past two months. Only one pound weight gain in the last month.   Reports to eating three meals a day and snacks.  - Will refer to nutrition. Discussed with Dr. 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

## 2015-03-24 NOTE — Patient Instructions (Signed)
Thank you for coming in,   Follow up with me in 4 weeks.   Please bring all of your medications with you to each visit.    Please feel free to call with any questions or concerns at any time, at [phone]. --Dr. Jordan LikesSchmitz  Preterm Labor Information Preterm labor is when labor starts at less than 37 weeks of pregnancy. The normal length of a pregnancy is 39 to 41 weeks. CAUSES Often, there is no identifiable underlying cause as to why a woman goes into preterm labor. One of the most common known causes of preterm labor is infection. Infections of the uterus, cervix, vagina, amniotic sac, bladder, kidney, or even the lungs (pneumonia) can cause labor to start. Other suspected causes of preterm labor include:   Urogenital infections, such as yeast infections and bacterial vaginosis.   Uterine abnormalities (uterine shape, uterine septum, fibroids, or bleeding from the placenta).   A cervix that has been operated on (it may fail to stay closed).   Malformations in the fetus.   Multiple gestations (twins, triplets, and so on).   Breakage of the amniotic sac.  RISK FACTORS 1. Having a previous history of preterm labor.  2. Having premature rupture of membranes (PROM).  3. Having a placenta that covers the opening of the cervix (placenta previa).  4. Having a placenta that separates from the uterus (placental abruption).  5. Having a cervix that is too weak to hold the fetus in the uterus (incompetent cervix).  6. Having too much fluid in the amniotic sac (polyhydramnios).  7. Taking illegal drugs or smoking while pregnant.  8. Not gaining enough weight while pregnant.  9. Being younger than 5218 and older than 21 years old.  10. Having a low socioeconomic status.  11. Being African American. SYMPTOMS Signs and symptoms of preterm labor include:   Menstrual-like cramps, abdominal pain, or back pain.  Uterine contractions that are regular, as frequent as six in an hour,  regardless of their intensity (may be mild or painful).  Contractions that start on the top of the uterus and spread down to the lower abdomen and back.   A sense of increased pelvic pressure.   A watery or bloody mucus discharge that comes from the vagina.  TREATMENT Depending on the length of the pregnancy and other circumstances, your health care provider may suggest bed rest. If necessary, there are medicines that can be given to stop contractions and to mature the fetal lungs. If labor happens before 34 weeks of pregnancy, a prolonged hospital stay may be recommended. Treatment depends on the condition of both you and the fetus.  WHAT SHOULD YOU DO IF YOU THINK YOU ARE IN PRETERM LABOR? Call your health care provider right away. You will need to go to the hospital to get checked immediately. HOW CAN YOU PREVENT PRETERM LABOR IN FUTURE PREGNANCIES? You should:   Stop smoking if you smoke.  Maintain healthy weight gain and avoid chemicals and drugs that are not necessary.  Be watchful for any type of infection.  Inform your health care provider if you have a known history of preterm labor. Document Released: 01/15/2004 Document Revised: 06/27/2013 Document Reviewed: 11/27/2012 [facility] Patient Information 2015 [employer] [place]. This information is not intended to replace advice given to you by your health care provider. Make sure you discuss any questions you have with your health care provider.  Fetal Movement Counts Patient Name: __________________________________________________ Patient Due Date: ____________________ Performing a fetal movement count is  highly recommended in high-risk pregnancies, but it is good for every pregnant woman to do. Your health care provider may ask you to start counting fetal movements at 28 weeks of the pregnancy. Fetal movements often increase:  After eating a full meal.  After physical activity.  After eating or drinking something sweet or  cold.  At rest. Pay attention to when you feel the baby is most active. This will help you notice a pattern of your baby's sleep and wake cycles and what factors contribute to an increase in fetal movement. It is important to perform a fetal movement count at the same time each day when your baby is normally most active.  HOW TO COUNT FETAL MOVEMENTS 12. Find a quiet and comfortable area to sit or lie down on your left side. Lying on your left side provides the best blood and oxygen circulation to your baby. 13. Write down the day and time on a sheet of paper or in a journal. 14. Start counting kicks, flutters, swishes, rolls, or jabs in a 2-hour period. You should feel at least 10 movements within 2 hours. 15. If you do not feel 10 movements in 2 hours, wait 2-3 hours and count again. Look for a change in the pattern or not enough counts in 2 hours. SEEK MEDICAL CARE IF:  You feel less than 10 counts in 2 hours, tried twice.  There is no movement in over an hour.  The pattern is changing or taking longer each day to reach 10 counts in 2 hours. You feel the baby is not moving as he or she usually does.

## 2015-03-25 NOTE — Progress Notes (Signed)
I was preceptor the day of this visit.   

## 2015-04-15 ENCOUNTER — Ambulatory Visit (HOSPITAL_COMMUNITY): Admission: RE | Admit: 2015-04-15 | Payer: Medicaid Other | Source: Ambulatory Visit

## 2015-04-17 ENCOUNTER — Other Ambulatory Visit: Payer: Self-pay | Admitting: Family Medicine

## 2015-04-17 ENCOUNTER — Ambulatory Visit (HOSPITAL_COMMUNITY)
Admission: RE | Admit: 2015-04-17 | Discharge: 2015-04-17 | Disposition: A | Discharge status: Home / Self Care | Payer: Medicaid Other | Source: Ambulatory Visit | Attending: Family Medicine | Admitting: Family Medicine

## 2015-04-17 ENCOUNTER — Ambulatory Visit (HOSPITAL_COMMUNITY): Payer: Medicaid Other

## 2015-04-17 DIAGNOSIS — Z36 Encounter for antenatal screening of mother: Secondary | ICD-10-CM | POA: Diagnosis not present

## 2015-04-17 DIAGNOSIS — Z3492 Encounter for supervision of normal pregnancy, unspecified, second trimester: Secondary | ICD-10-CM

## 2015-04-17 DIAGNOSIS — Z3A22 22 weeks gestation of pregnancy: Secondary | ICD-10-CM | POA: Insufficient documentation

## 2015-04-17 DIAGNOSIS — Z3689 Encounter for other specified antenatal screening: Secondary | ICD-10-CM | POA: Insufficient documentation

## 2015-04-21 ENCOUNTER — Telehealth: Payer: Self-pay | Admitting: Family Medicine

## 2015-04-21 ENCOUNTER — Encounter: Payer: Medicaid Other | Admitting: Family Medicine

## 2015-04-21 NOTE — Telephone Encounter (Signed)
Left VM for patient. If she calls back please have her speak with a nurse/CMA and inform that she missed her OB appt today. She can re-schedule with me. She needs to schedule her 28 week appt with the OB clinic.    If any questions then please take the best time and phone number to call and I will try to call back.   Myra Rude, MD PGY-2, [facility] 04/21/2015, 3:00 PM

## 2015-04-29 ENCOUNTER — Other Ambulatory Visit (HOSPITAL_COMMUNITY): Payer: Medicaid Other

## 2015-05-20 ENCOUNTER — Ambulatory Visit (INDEPENDENT_AMBULATORY_CARE_PROVIDER_SITE_OTHER): Payer: Medicaid Other | Admitting: Family Medicine

## 2015-05-20 VITALS — BP 120/67 | HR 68 | Temp 98.3°F | Wt 123.0 lb

## 2015-05-20 DIAGNOSIS — Z3403 Encounter for supervision of normal first pregnancy, third trimester: Secondary | ICD-10-CM

## 2015-05-20 DIAGNOSIS — Z23 Encounter for immunization: Secondary | ICD-10-CM

## 2015-05-20 DIAGNOSIS — J309 Allergic rhinitis, unspecified: Secondary | ICD-10-CM | POA: Diagnosis not present

## 2015-05-20 DIAGNOSIS — Z3402 Encounter for supervision of normal first pregnancy, second trimester: Secondary | ICD-10-CM | POA: Diagnosis present

## 2015-05-20 LAB — CBC
HCT: 34 % — ABNORMAL LOW (ref 36.0–46.0)
Hemoglobin: 11.5 g/dL — ABNORMAL LOW (ref 12.0–15.0)
MCH: 29.3 pg (ref 26.0–34.0)
MCHC: 33.8 g/dL (ref 30.0–36.0)
MCV: 86.7 fL (ref 78.0–100.0)
MPV: 9.4 fL (ref 8.6–12.4)
Platelets: 301 10*3/uL (ref 150–400)
RBC: 3.92 MIL/uL (ref 3.87–5.11)
RDW: 13.5 % (ref 11.5–15.5)
WBC: 7.1 10*3/uL (ref 4.0–10.5)

## 2015-05-20 LAB — GLUCOSE, CAPILLARY
Comment 1: 1
Glucose-Capillary: 85 mg/dL (ref 65–99)

## 2015-05-20 MED ORDER — FLUTICASONE PROPIONATE 50 MCG/ACT NA SUSP: 2.0000 | Freq: Every day | NASAL | Status: DC

## 2015-05-20 NOTE — Progress Notes (Signed)
Anne Vaughn is a 19 y.o. G1P0 at 1225w4d for routine follow up.  She reports congestion.  See flow sheet for details.  A/P: Pregnancy at 4525w4d.  Doing well.   Pregnancy issues include congestion.  Childbirth and education classes were offered. Preterm labor precautions reviewed. Follow up 4 weeks.

## 2015-05-20 NOTE — Patient Instructions (Signed)
Thank you for coming in,   Please make an appointment with the OB clinic when you leave her today.   Please make that appointment 4 weeks from today.   I will call you with the lab results from today.   Please bring all of your medications with you to each visit.    Please feel free to call with any questions or concerns at any time, at [phone]. --Dr. Jordan Likes  Third Trimester of Pregnancy The third trimester is from week 29 through week 42, months 7 through 9. The third trimester is a time when the fetus is growing rapidly. At the end of the ninth month, the fetus is about 20 inches in length and weighs 6-10 pounds.  BODY CHANGES Your body goes through many changes during pregnancy. The changes vary from woman to woman.   Your weight will continue to increase. You can expect to gain 25-35 pounds (11-16 kg) by the end of the pregnancy.  You may begin to get stretch marks on your hips, abdomen, and breasts.  You may urinate more often because the fetus is moving lower into your pelvis and pressing on your bladder.  You may develop or continue to have heartburn as a result of your pregnancy.  You may develop constipation because certain hormones are causing the muscles that push waste through your intestines to slow down.  You may develop hemorrhoids or swollen, bulging veins (varicose veins).  You may have pelvic pain because of the weight gain and pregnancy hormones relaxing your joints between the bones in your pelvis. Backaches may result from overexertion of the muscles supporting your posture.  You may have changes in your hair. These can include thickening of your hair, rapid growth, and changes in texture. Some women also have hair loss during or after pregnancy, or hair that feels dry or thin. Your hair will most likely return to normal after your baby is born.  Your breasts will continue to grow and be tender. A yellow discharge may leak from your breasts called  colostrum.  Your belly button may stick out.  You may feel short of breath because of your expanding uterus.  You may notice the fetus "dropping," or moving lower in your abdomen.  You may have a bloody mucus discharge. This usually occurs a few days to a week before labor begins.  Your cervix becomes thin and soft (effaced) near your due date. WHAT TO EXPECT AT YOUR PRENATAL EXAMS  You will have prenatal exams every 2 weeks until week 36. Then, you will have weekly prenatal exams. During a routine prenatal visit:  You will be weighed to make sure you and the fetus are growing normally.  Your blood pressure is taken.  Your abdomen will be measured to track your baby's growth.  The fetal heartbeat will be listened to.  Any test results from the previous visit will be discussed.  You may have a cervical check near your due date to see if you have effaced. At around 36 weeks, your caregiver will check your cervix. At the same time, your caregiver will also perform a test on the secretions of the vaginal tissue. This test is to determine if a type of bacteria, Group B streptococcus, is present. Your caregiver will explain this further. Your caregiver may ask you:  What your birth plan is.  How you are feeling.  If you are feeling the baby move.  If you have had any abnormal symptoms, such as leaking  fluid, bleeding, severe headaches, or abdominal cramping.  If you have any questions. Other tests or screenings that may be performed during your third trimester include:  Blood tests that check for low iron levels (anemia).  Fetal testing to check the health, activity level, and growth of the fetus. Testing is done if you have certain medical conditions or if there are problems during the pregnancy. FALSE LABOR You may feel small, irregular contractions that eventually go away. These are called Braxton Hicks contractions, or false labor. Contractions may last for hours, days, or  even weeks before true labor sets in. If contractions come at regular intervals, intensify, or become painful, it is best to be seen by your caregiver.  SIGNS OF LABOR   Menstrual-like cramps.  Contractions that are 5 minutes apart or less.  Contractions that start on the top of the uterus and spread down to the lower abdomen and back.  A sense of increased pelvic pressure or back pain.  A watery or bloody mucus discharge that comes from the vagina. If you have any of these signs before the 37th week of pregnancy, call your caregiver right away. You need to go to the hospital to get checked immediately. HOME CARE INSTRUCTIONS   Avoid all smoking, herbs, alcohol, and unprescribed drugs. These chemicals affect the formation and growth of the baby.  Follow your caregiver's instructions regarding medicine use. There are medicines that are either safe or unsafe to take during pregnancy.  Exercise only as directed by your caregiver. Experiencing uterine cramps is a good sign to stop exercising.  Continue to eat regular, healthy meals.  Wear a good support bra for breast tenderness.  Do not use hot tubs, steam rooms, or saunas.  Wear your seat belt at all times when driving.  Avoid raw meat, uncooked cheese, cat litter boxes, and soil used by cats. These carry germs that can cause birth defects in the baby.  Take your prenatal vitamins.  Try taking a stool softener (if your caregiver approves) if you develop constipation. Eat more high-fiber foods, such as fresh vegetables or fruit and whole grains. Drink plenty of fluids to keep your urine clear or pale yellow.  Take warm sitz baths to soothe any pain or discomfort caused by hemorrhoids. Use hemorrhoid cream if your caregiver approves.  If you develop varicose veins, wear support hose. Elevate your feet for 15 minutes, 3-4 times a day. Limit salt in your diet.  Avoid heavy lifting, wear low heal shoes, and practice good  posture.  Rest a lot with your legs elevated if you have leg cramps or low back pain.  Visit your dentist if you have not gone during your pregnancy. Use a soft toothbrush to brush your teeth and be gentle when you floss.  A sexual relationship may be continued unless your caregiver directs you otherwise.  Do not travel far distances unless it is absolutely necessary and only with the approval of your caregiver.  Take prenatal classes to understand, practice, and ask questions about the labor and delivery.  Make a trial run to the hospital.  Pack your hospital bag.  Prepare the baby's nursery.  Continue to go to all your prenatal visits as directed by your caregiver. SEEK MEDICAL CARE IF:  You are unsure if you are in labor or if your water has broken.  You have dizziness.  You have mild pelvic cramps, pelvic pressure, or nagging pain in your abdominal area.  You have persistent nausea, vomiting,  or diarrhea.  You have a bad smelling vaginal discharge.  You have pain with urination. SEEK IMMEDIATE MEDICAL CARE IF:   You have a fever.  You are leaking fluid from your vagina.  You have spotting or bleeding from your vagina.  You have severe abdominal cramping or pain.  You have rapid weight loss or gain.  You have shortness of breath with chest pain.  You notice sudden or extreme swelling of your face, hands, ankles, feet, or legs.  You have not felt your baby move in over an hour.  You have severe headaches that do not go away with medicine.  You have vision changes. Document Released: 10/19/2001 Document Revised: 10/30/2013 Document Reviewed: 12/26/2012 [facility] Patient Information 2015 [place], [place]. This information is not intended to replace advice given to you by your health care provider. Make sure you discuss any questions you have with your health care provider.

## 2015-05-21 LAB — RPR

## 2015-05-21 LAB — HIV ANTIBODY (ROUTINE TESTING W REFLEX): HIV: NONREACTIVE

## 2015-05-23 ENCOUNTER — Telehealth: Payer: Self-pay | Admitting: Family Medicine

## 2015-05-23 NOTE — Assessment & Plan Note (Signed)
Is gaining weight. Doing well with some nasal congestion  - Gtt today  - Tdap  - lab taken  - flonase given

## 2015-05-23 NOTE — Telephone Encounter (Signed)
Left VM for patient. If she calls back please have her speak with a nurse/CMA and inform that labs looked good. She is having some anemia but this is expected in pregnancy. She can help this by eating iron fortified foods such as meat, green leafy vegetables or certain cereals.   If any questions then please take the best time and phone number to call and I will try to call her back.   Myra RudeJeremy E Osmara Drummonds, MD PGY-3, [facility] 05/23/2015, 4:37 PM

## 2015-06-19 ENCOUNTER — Ambulatory Visit (INDEPENDENT_AMBULATORY_CARE_PROVIDER_SITE_OTHER): Payer: Medicaid Other | Admitting: Family Medicine

## 2015-06-19 VITALS — BP 128/69 | HR 93 | Temp 98.2°F | Wt 128.0 lb

## 2015-06-19 DIAGNOSIS — Z3493 Encounter for supervision of normal pregnancy, unspecified, third trimester: Secondary | ICD-10-CM

## 2015-06-19 NOTE — Progress Notes (Signed)
Anne Vaughn is a 19 y.o. G1P0 at [redacted]w[redacted]d for routine follow up.  She reports no concern, she is compliant with her prenatal vitamin.  See flow sheet for details.  A/P: Pregnancy at [redacted]w[redacted]d.  Doing well.   Pregnancy issues include:N/A Infant feeding choice: Breast feed. Contraception choice: Discuss with PCP. Infant circumcision desired yes  Tdapwas not given today. GBS/GC/CZ testing was not performed today. She had positive urine GBS. May repeat GC/Chlamydia at [redacted] weeks GA or if symptomatic.  Preterm labor precautions reviewed. Safe sleep discussed. Kick counts reviewed. Follow up 2 weeks.

## 2015-06-19 NOTE — Patient Instructions (Signed)
Prenatal Care  WHAT IS PRENATAL CARE?  Prenatal care means health care during your pregnancy, before your baby is born. It is very important to take care of yourself and your baby during your pregnancy by:   Getting early prenatal care. If you know you are pregnant, or think you might be pregnant, call your health care provider as soon as possible. Schedule a visit for a prenatal exam.  Getting regular prenatal care. Follow your health care provider's schedule for blood and other necessary tests. Do not miss appointments.  Doing everything you can to keep yourself and your baby healthy during your pregnancy.  Getting complete care. Prenatal care should include evaluation of the medical, dietary, educational, psychological, and social needs of you and your significant other. The medical and genetic history of your family and the family of your baby's father should be discussed with your health care provider.  Discussing with your health care provider:  Prescription, over-the-counter, and herbal medicines that you take.  Any history of substance abuse, alcohol use, smoking, and illegal drug use.  Any history of domestic abuse and violence.  Immunizations you have received.  Your nutrition and diet.  The amount of exercise you do.  Any environmental and occupational hazards to which you are exposed.  History of sexually transmitted infections for both you and your partner.  Previous pregnancies you have had. WHY IS PRENATAL CARE SO IMPORTANT?  By regularly seeing your health care provider, you help ensure that problems can be identified early so that they can be treated as soon as possible. Other problems might be prevented. Many studies have shown that early and regular prenatal care is important for the health of mothers and their babies.  HOW CAN I TAKE CARE OF MYSELF WHILE I AM PREGNANT?  Here are ways to take care of yourself and your baby:   Start or continue taking your  multivitamin with 400 micrograms (mcg) of folic acid every day.  Get early and regular prenatal care. It is very important to see a health care provider during your pregnancy. Your health care provider will check at each visit to make sure that you and your baby are healthy. If there are any problems, action can be taken right away to help you and your baby.  Eat a healthy diet that includes:  Fruits.  Vegetables.  Foods low in saturated fat.  Whole grains.  Calcium-rich foods, such as milk, yogurt, and hard cheeses.  Drink 6-8 glasses of liquids a day.  Unless your health care provider tells you not to, try to be physically active for 30 minutes, most days of the week. If you are pressed for time, you can get your activity in through 10-minute segments, three times a day.  Do not smoke, drink alcohol, or use drugs. These can cause long-term damage to your baby. Talk with your health care provider about steps to take to stop smoking. Talk with a member of your faith community, a counselor, a trusted friend, or your health care provider if you are concerned about your alcohol or drug use.  Ask your health care provider before taking any medicine, even over-the-counter medicines. Some medicines are not safe to take during pregnancy.  Get plenty of rest and sleep.  Avoid hot tubs and saunas during pregnancy.  Do not have X-rays taken unless absolutely necessary and with the recommendation of your health care provider. A lead shield can be placed on your abdomen to protect your baby when   X-rays are taken in other parts of your body.  Do not empty the cat litter when you are pregnant. It may contain a parasite that causes an infection called toxoplasmosis, which can cause birth defects. Also, use gloves when working in garden areas used by cats.  Do not eat uncooked or undercooked meats or fish.  Do not eat soft, mold-ripened cheeses (Brie, Camembert, and chevre) or soft, blue-veined  cheese (Danish blue and Roquefort).  Stay away from toxic chemicals like:  Insecticides.  Solvents (some cleaners or paint thinners).  Lead.  Mercury.  Sexual intercourse may continue until the end of the pregnancy, unless you have a medical problem or there is a problem with the pregnancy and your health care provider tells you not to.  Do not wear high-heel shoes, especially during the second half of the pregnancy. You can lose your balance and fall.  Do not take long trips, unless absolutely necessary. Be sure to see your health care provider before going on the trip.  Do not sit in one position for more than 2 hours when on a trip.  Take a copy of your medical records when going on a trip. Know where a hospital is located in the city you are visiting, in case of an emergency.  Most dangerous household products will have pregnancy warnings on their labels. Ask your health care provider about products if you are unsure.  Limit or eliminate your caffeine intake from coffee, tea, sodas, medicines, and chocolate.  Many women continue working through pregnancy. Staying active might help you stay healthier. If you have a question about the safety or the hours you work at your particular job, talk with your health care provider.  Get informed:  Read books.  Watch videos.  Go to childbirth classes for you and your significant other.  Talk with experienced moms.  Ask your health care provider about childbirth education classes for you and your partner. Classes can help you and your partner prepare for the birth of your baby.  Ask about a baby doctor (pediatrician) and methods and pain medicine for labor, delivery, and possible cesarean delivery. HOW OFTEN SHOULD I SEE MY HEALTH CARE PROVIDER DURING PREGNANCY?  Your health care provider will give you a schedule for your prenatal visits. You will have visits more often as you get closer to the end of your pregnancy. An average  pregnancy lasts about 40 weeks.  A typical schedule includes visiting your health care provider:   About once each month during your first 6 months of pregnancy.  Every 2 weeks during the next 2 months.  Weekly in the last month, until the delivery date. Your health care provider will probably want to see you more often if:  You are older than 35 years.  Your pregnancy is high risk because you have certain health problems or problems with the pregnancy, such as:  Diabetes.  High blood pressure.  The baby is not growing on schedule, according to the dates of the pregnancy. Your health care provider will do special tests to make sure you and your baby are not having any serious problems. WHAT HAPPENS DURING PRENATAL VISITS?   At your first prenatal visit, your health care provider will do a physical exam and talk to you about your health history and the health history of your partner and your family. Your health care provider will be able to tell you what date to expect your baby to be born on.  Your   first physical exam will include checks of your blood pressure, measurements of your height and weight, and an exam of your pelvic organs. Your health care provider will do a Pap test if you have not had one recently and will do cultures of your cervix to make sure there is no infection.  At each prenatal visit, there will be tests of your blood, urine, blood pressure, weight, and the progress of the baby will be checked.  At your later prenatal visits, your health care provider will check how you are doing and how your baby is developing. You may have a number of tests done as your pregnancy progresses.  Ultrasound exams are often used to check on your baby's growth and health.  You may have more urine and blood tests, as well as special tests, if needed. These may include amniocentesis to examine fluid in the pregnancy sac, stress tests to check how the baby responds to contractions, or a  biophysical profile to measure your baby's well-being. Your health care provider will explain the tests and why they are necessary.  You should be tested for high blood sugar (gestational diabetes) between the 24th and 28th weeks of your pregnancy.  You should discuss with your health care provider your plans to breastfeed or bottle-feed your baby.  Each visit is also a chance for you to learn about staying healthy during pregnancy and to ask questions. Document Released: 10/28/2003 Document Revised: 10/30/2013 Document Reviewed: 01/09/2014 [employer] Patient Information 2015 [employer]. This information is not intended to replace advice given to you by your health care provider. Make sure you discuss any questions you have with your health care provider.  

## 2015-07-09 ENCOUNTER — Ambulatory Visit (INDEPENDENT_AMBULATORY_CARE_PROVIDER_SITE_OTHER): Payer: Medicaid Other | Admitting: Family Medicine

## 2015-07-09 VITALS — BP 127/66 | HR 75 | Temp 99.3°F | Wt 132.8 lb

## 2015-07-09 DIAGNOSIS — Z3402 Encounter for supervision of normal first pregnancy, second trimester: Secondary | ICD-10-CM

## 2015-07-09 NOTE — Patient Instructions (Signed)
Third Trimester of Pregnancy The third trimester is from week 29 through week 42, months 7 through 9. The third trimester is a time when the fetus is growing rapidly. At the end of the ninth month, the fetus is about 20 inches in length and weighs 6-10 pounds.  BODY CHANGES Your body goes through many changes during pregnancy. The changes vary from woman to woman.   Your weight will continue to increase. You can expect to gain 25-35 pounds (11-16 kg) by the end of the pregnancy.  You may begin to get stretch marks on your hips, abdomen, and breasts.  You may urinate more often because the fetus is moving lower into your pelvis and pressing on your bladder.  You may develop or continue to have heartburn as a result of your pregnancy.  You may develop constipation because certain hormones are causing the muscles that push waste through your intestines to slow down.  You may develop hemorrhoids or swollen, bulging veins (varicose veins).  You may have pelvic pain because of the weight gain and pregnancy hormones relaxing your joints between the bones in your pelvis. Backaches may result from overexertion of the muscles supporting your posture.  You may have changes in your hair. These can include thickening of your hair, rapid growth, and changes in texture. Some women also have hair loss during or after pregnancy, or hair that feels dry or thin. Your hair will most likely return to normal after your baby is born.  Your breasts will continue to grow and be tender. A yellow discharge may leak from your breasts called colostrum.  Your belly button may stick out.  You may feel short of breath because of your expanding uterus.  You may notice the fetus "dropping," or moving lower in your abdomen.  You may have a bloody mucus discharge. This usually occurs a few days to a week before labor begins.  Your cervix becomes thin and soft (effaced) near your due date. WHAT TO EXPECT AT YOUR PRENATAL  EXAMS  You will have prenatal exams every 2 weeks until week 36. Then, you will have weekly prenatal exams. During a routine prenatal visit:  You will be weighed to make sure you and the fetus are growing normally.  Your blood pressure is taken.  Your abdomen will be measured to track your baby's growth.  The fetal heartbeat will be listened to.  Any test results from the previous visit will be discussed.  You may have a cervical check near your due date to see if you have effaced. At around 36 weeks, your caregiver will check your cervix. At the same time, your caregiver will also perform a test on the secretions of the vaginal tissue. This test is to determine if a type of bacteria, Group B streptococcus, is present. Your caregiver will explain this further. Your caregiver may ask you:  What your birth plan is.  How you are feeling.  If you are feeling the baby move.  If you have had any abnormal symptoms, such as leaking fluid, bleeding, severe headaches, or abdominal cramping.  If you have any questions. Other tests or screenings that may be performed during your third trimester include:  Blood tests that check for low iron levels (anemia).  Fetal testing to check the health, activity level, and growth of the fetus. Testing is done if you have certain medical conditions or if there are problems during the pregnancy. FALSE LABOR You may feel small, irregular contractions that   eventually go away. These are called Braxton Hicks contractions, or false labor. Contractions may last for hours, days, or even weeks before true labor sets in. If contractions come at regular intervals, intensify, or become painful, it is best to be seen by your caregiver.  SIGNS OF LABOR   Menstrual-like cramps.  Contractions that are 5 minutes apart or less.  Contractions that start on the top of the uterus and spread down to the lower abdomen and back.  A sense of increased pelvic pressure or back  pain.  A watery or bloody mucus discharge that comes from the vagina. If you have any of these signs before the 37th week of pregnancy, call your caregiver right away. You need to go to the hospital to get checked immediately. HOME CARE INSTRUCTIONS   Avoid all smoking, herbs, alcohol, and unprescribed drugs. These chemicals affect the formation and growth of the baby.  Follow your caregiver's instructions regarding medicine use. There are medicines that are either safe or unsafe to take during pregnancy.  Exercise only as directed by your caregiver. Experiencing uterine cramps is a good sign to stop exercising.  Continue to eat regular, healthy meals.  Wear a good support bra for breast tenderness.  Do not use hot tubs, steam rooms, or saunas.  Wear your seat belt at all times when driving.  Avoid raw meat, uncooked cheese, cat litter boxes, and soil used by cats. These carry germs that can cause birth defects in the baby.  Take your prenatal vitamins.  Try taking a stool softener (if your caregiver approves) if you develop constipation. Eat more high-fiber foods, such as fresh vegetables or fruit and whole grains. Drink plenty of fluids to keep your urine clear or pale yellow.  Take warm sitz baths to soothe any pain or discomfort caused by hemorrhoids. Use hemorrhoid cream if your caregiver approves.  If you develop varicose veins, wear support hose. Elevate your feet for 15 minutes, 3-4 times a day. Limit salt in your diet.  Avoid heavy lifting, wear low heal shoes, and practice good posture.  Rest a lot with your legs elevated if you have leg cramps or low back pain.  Visit your dentist if you have not gone during your pregnancy. Use a soft toothbrush to brush your teeth and be gentle when you floss.  A sexual relationship may be continued unless your caregiver directs you otherwise.  Do not travel far distances unless it is absolutely necessary and only with the approval  of your caregiver.  Take prenatal classes to understand, practice, and ask questions about the labor and delivery.  Make a trial run to the hospital.  Pack your hospital bag.  Prepare the baby's nursery.  Continue to go to all your prenatal visits as directed by your caregiver. SEEK MEDICAL CARE IF:  You are unsure if you are in labor or if your water has broken.  You have dizziness.  You have mild pelvic cramps, pelvic pressure, or nagging pain in your abdominal area.  You have persistent nausea, vomiting, or diarrhea.  You have a bad smelling vaginal discharge.  You have pain with urination. SEEK IMMEDIATE MEDICAL CARE IF:   You have a fever.  You are leaking fluid from your vagina.  You have spotting or bleeding from your vagina.  You have severe abdominal cramping or pain.  You have rapid weight loss or gain.  You have shortness of breath with chest pain.  You notice sudden or extreme swelling   of your face, hands, ankles, feet, or legs.  You have not felt your baby move in over an hour.  You have severe headaches that do not go away with medicine.  You have vision changes. Document Released: 10/19/2001 Document Revised: 10/30/2013 Document Reviewed: 12/26/2012 [employer] Patient Information 2015 [employer]. This information is not intended to replace advice given to you by your health care provider. Make sure you discuss any questions you have with your health care provider.  

## 2015-07-09 NOTE — Progress Notes (Signed)
Anne Vaughn is a 19 y.o. G1P0 at [redacted]w[redacted]d for routine follow up.  She reports some Braxton Hicks ctx.  See flow sheet for details.  A/P: Pregnancy at [redacted]w[redacted]d.  Doing well.   Pregnancy issues include none  Infant feeding choice breast  Contraception choice OCP's  Infant circumcision desired unsure   Tdapwas not given today. Given 05/20/15 GBS/GC/CZ testing was not performed today.  Preterm labor precautions reviewed. Safe sleep discussed. Kick counts reviewed. Follow up 2 weeks.

## 2015-07-10 NOTE — Assessment & Plan Note (Signed)
No complaints.  - GC/Ch next time  - f/u in two weeks  - ask about circ

## 2015-07-11 NOTE — Progress Notes (Signed)
error 

## 2015-07-22 ENCOUNTER — Ambulatory Visit (INDEPENDENT_AMBULATORY_CARE_PROVIDER_SITE_OTHER): Payer: Medicaid Other | Admitting: Family Medicine

## 2015-07-22 ENCOUNTER — Other Ambulatory Visit (HOSPITAL_COMMUNITY)
Admission: RE | Admit: 2015-07-22 | Discharge: 2015-07-22 | Disposition: A | Discharge status: Home / Self Care | Payer: Medicaid Other | Source: Ambulatory Visit | Attending: Family Medicine | Admitting: Family Medicine

## 2015-07-22 VITALS — BP 123/72 | HR 88 | Temp 99.3°F | Wt 134.8 lb

## 2015-07-22 DIAGNOSIS — Z113 Encounter for screening for infections with a predominantly sexual mode of transmission: Secondary | ICD-10-CM | POA: Insufficient documentation

## 2015-07-22 DIAGNOSIS — Z3403 Encounter for supervision of normal first pregnancy, third trimester: Secondary | ICD-10-CM

## 2015-07-22 LAB — OB RESULTS CONSOLE GC/CHLAMYDIA: Gonorrhea: NEGATIVE

## 2015-07-22 NOTE — Patient Instructions (Addendum)
Thank you for coming in,   Please follow up with me in one week.   Sign up for My Chart to have easy access to your labs results, and communication with your Primary care physician   Please feel free to call with any questions or concerns at any time, at [phone]. --Dr. Jordan Likes  Breastfeeding Deciding to breastfeed is one of the best choices you can make for you and your baby. A change in hormones during pregnancy causes your breast tissue to grow and increases the number and size of your milk ducts. These hormones also allow proteins, sugars, and fats from your blood supply to make breast milk in your milk-producing glands. Hormones prevent breast milk from being released before your baby is born as well as prompt milk flow after birth. Once breastfeeding has begun, thoughts of your baby, as well as his or her sucking or crying, can stimulate the release of milk from your milk-producing glands.  BENEFITS OF BREASTFEEDING For Your Baby  Your first milk (colostrum) helps your baby's digestive system function better.   There are antibodies in your milk that help your baby fight off infections.   Your baby has a lower incidence of asthma, allergies, and sudden infant death syndrome.   The nutrients in breast milk are better for your baby than infant formulas and are designed uniquely for your baby's needs.   Breast milk improves your baby's brain development.   Your baby is less likely to develop other conditions, such as childhood obesity, asthma, or type 2 diabetes mellitus.  For You   Breastfeeding helps to create a very special bond between you and your baby.   Breastfeeding is convenient. Breast milk is always available at the correct temperature and costs nothing.   Breastfeeding helps to burn calories and helps you lose the weight gained during pregnancy.   Breastfeeding makes your uterus contract to its prepregnancy size faster and slows bleeding (lochia) after you give  birth.   Breastfeeding helps to lower your risk of developing type 2 diabetes mellitus, osteoporosis, and breast or ovarian cancer later in life. SIGNS THAT YOUR BABY IS HUNGRY Early Signs of Hunger  Increased alertness or activity.  Stretching.  Movement of the head from side to side.  Movement of the head and opening of the mouth when the corner of the mouth or cheek is stroked (rooting).  Increased sucking sounds, smacking lips, cooing, sighing, or squeaking.  Hand-to-mouth movements.  Increased sucking of fingers or hands. Late Signs of Hunger  Fussing.  Intermittent crying. Extreme Signs of Hunger Signs of extreme hunger will require calming and consoling before your baby will be able to breastfeed successfully. Do not wait for the following signs of extreme hunger to occur before you initiate breastfeeding:   Restlessness.  A loud, strong cry.   Screaming. BREASTFEEDING BASICS Breastfeeding Initiation  Find a comfortable place to sit or lie down, with your neck and back well supported.  Place a pillow or rolled up blanket under your baby to bring him or her to the level of your breast (if you are seated). Nursing pillows are specially designed to help support your arms and your baby while you breastfeed.  Make sure that your baby's abdomen is facing your abdomen.   Gently massage your breast. With your fingertips, massage from your chest wall toward your nipple in a circular motion. This encourages milk flow. You may need to continue this action during the feeding if your milk flows  slowly.  Support your breast with 4 fingers underneath and your thumb above your nipple. Make sure your fingers are well away from your nipple and your baby's mouth.   Stroke your baby's lips gently with your finger or nipple.   When your baby's mouth is open wide enough, quickly bring your baby to your breast, placing your entire nipple and as much of the colored area around  your nipple (areola) as possible into your baby's mouth.   More areola should be visible above your baby's upper lip than below the lower lip.   Your baby's tongue should be between his or her lower gum and your breast.   Ensure that your baby's mouth is correctly positioned around your nipple (latched). Your baby's lips should create a seal on your breast and be turned out (everted).  It is common for your baby to suck about 2-3 minutes in order to start the flow of breast milk. Latching Teaching your baby how to latch on to your breast properly is very important. An improper latch can cause nipple pain and decreased milk supply for you and poor weight gain in your baby. Also, if your baby is not latched onto your nipple properly, he or she may swallow some air during feeding. This can make your baby fussy. Burping your baby when you switch breasts during the feeding can help to get rid of the air. However, teaching your baby to latch on properly is still the best way to prevent fussiness from swallowing air while breastfeeding. Signs that your baby has successfully latched on to your nipple:    Silent tugging or silent sucking, without causing you pain.   Swallowing heard between every 3-4 sucks.    Muscle movement above and in front of his or her ears while sucking.  Signs that your baby has not successfully latched on to nipple:   Sucking sounds or smacking sounds from your baby while breastfeeding.  Nipple pain. If you think your baby has not latched on correctly, slip your finger into the corner of your baby's mouth to break the suction and place it between your baby's gums. Attempt breastfeeding initiation again. Signs of Successful Breastfeeding Signs from your baby:   A gradual decrease in the number of sucks or complete cessation of sucking.   Falling asleep.   Relaxation of his or her body.   Retention of a small amount of milk in his or her mouth.   Letting  go of your breast by himself or herself. Signs from you:  Breasts that have increased in firmness, weight, and size 1-3 hours after feeding.   Breasts that are softer immediately after breastfeeding.  Increased milk volume, as well as a change in milk consistency and color by the fifth day of breastfeeding.   Nipples that are not sore, cracked, or bleeding. Signs That Your Pecola Leisure is Getting Enough Milk  Wetting at least 3 diapers in a 24-hour period. The urine should be clear and pale yellow by age 85 days.  At least 3 stools in a 24-hour period by age 85 days. The stool should be soft and yellow.  At least 3 stools in a 24-hour period by age 63 days. The stool should be seedy and yellow.  No loss of weight greater than 10% of birth weight during the first 2 days of age.  Average weight gain of 4-7 ounces (113-198 g) per week after age 44 days.  Consistent daily weight gain by age 633  days, without weight loss after the age of 2 weeks. After a feeding, your baby may spit up a small amount. This is common. BREASTFEEDING FREQUENCY AND DURATION Frequent feeding will help you make more milk and can prevent sore nipples and breast engorgement. Breastfeed when you feel the need to reduce the fullness of your breasts or when your baby shows signs of hunger. This is called "breastfeeding on demand." Avoid introducing a pacifier to your baby while you are working to establish breastfeeding (the first 4-6 weeks after your baby is born). After this time you may choose to use a pacifier. Research has shown that pacifier use during the first year of a baby's life decreases the risk of sudden infant death syndrome (SIDS). Allow your baby to feed on each breast as long as he or she wants. Breastfeed until your baby is finished feeding. When your baby unlatches or falls asleep while feeding from the first breast, offer the second breast. Because newborns are often sleepy in the first few weeks of life, you may  need to awaken your baby to get him or her to feed. Breastfeeding times will vary from baby to baby. However, the following rules can serve as a guide to help you ensure that your baby is properly fed:  Newborns (babies 27 weeks of age or younger) may breastfeed every 1-3 hours.  Newborns should not go longer than 3 hours during the day or 5 hours during the night without breastfeeding.  You should breastfeed your baby a minimum of 8 times in a 24-hour period until you begin to introduce solid foods to your baby at around 54 months of age. BREAST MILK PUMPING Pumping and storing breast milk allows you to ensure that your baby is exclusively fed your breast milk, even at times when you are unable to breastfeed. This is especially important if you are going back to work while you are still breastfeeding or when you are not able to be present during feedings. Your lactation consultant can give you guidelines on how long it is safe to store breast milk.  A breast pump is a machine that allows you to pump milk from your breast into a sterile bottle. The pumped breast milk can then be stored in a refrigerator or freezer. Some breast pumps are operated by hand, while others use electricity. Ask your lactation consultant which type will work best for you. Breast pumps can be purchased, but some hospitals and breastfeeding support groups lease breast pumps on a monthly basis. A lactation consultant can teach you how to hand express breast milk, if you prefer not to use a pump.  CARING FOR YOUR BREASTS WHILE YOU BREASTFEED Nipples can become dry, cracked, and sore while breastfeeding. The following recommendations can help keep your breasts moisturized and healthy:  Avoid using soap on your nipples.   Wear a supportive bra. Although not required, special nursing bras and tank tops are designed to allow access to your breasts for breastfeeding without taking off your entire bra or top. Avoid wearing  underwire-style bras or extremely tight bras.  Air dry your nipples for 3-52minutes after each feeding.   Use only cotton bra pads to absorb leaked breast milk. Leaking of breast milk between feedings is normal.   Use lanolin on your nipples after breastfeeding. Lanolin helps to maintain your skin's normal moisture barrier. If you use pure lanolin, you do not need to wash it off before feeding your baby again. Pure lanolin is not  toxic to your baby. You may also hand express a few drops of breast milk and gently massage that milk into your nipples and allow the milk to air dry. In the first few weeks after giving birth, some women experience extremely full breasts (engorgement). Engorgement can make your breasts feel heavy, warm, and tender to the touch. Engorgement peaks within 3-5 days after you give birth. The following recommendations can help ease engorgement:  Completely empty your breasts while breastfeeding or pumping. You may want to start by applying warm, moist heat (in the shower or with warm water-soaked hand towels) just before feeding or pumping. This increases circulation and helps the milk flow. If your baby does not completely empty your breasts while breastfeeding, pump any extra milk after he or she is finished.  Wear a snug bra (nursing or regular) or tank top for 1-2 days to signal your body to slightly decrease milk production.  Apply ice packs to your breasts, unless this is too uncomfortable for you.  Make sure that your baby is latched on and positioned properly while breastfeeding. If engorgement persists after 48 hours of following these recommendations, contact your health care provider or a Advertising copywriter. OVERALL HEALTH CARE RECOMMENDATIONS WHILE BREASTFEEDING  Eat healthy foods. Alternate between meals and snacks, eating 3 of each per day. Because what you eat affects your breast milk, some of the foods may make your baby more irritable than usual. Avoid  eating these foods if you are sure that they are negatively affecting your baby.  Drink milk, fruit juice, and water to satisfy your thirst (about 10 glasses a day).   Rest often, relax, and continue to take your prenatal vitamins to prevent fatigue, stress, and anemia.  Continue breast self-awareness checks.  Avoid chewing and smoking tobacco.  Avoid alcohol and drug use. Some medicines that may be harmful to your baby can pass through breast milk. It is important to ask your health care provider before taking any medicine, including all over-the-counter and prescription medicine as well as vitamin and herbal supplements. It is possible to become pregnant while breastfeeding. If birth control is desired, ask your health care provider about options that will be safe for your baby. SEEK MEDICAL CARE IF:   You feel like you want to stop breastfeeding or have become frustrated with breastfeeding.  You have painful breasts or nipples.  Your nipples are cracked or bleeding.  Your breasts are red, tender, or warm.  You have a swollen area on either breast.  You have a fever or chills.  You have nausea or vomiting.  You have drainage other than breast milk from your nipples.  Your breasts do not become full before feedings by the fifth day after you give birth.  You feel sad and depressed.  Your baby is too sleepy to eat well.  Your baby is having trouble sleeping.   Your baby is wetting less than 3 diapers in a 24-hour period.  Your baby has less than 3 stools in a 24-hour period.  Your baby's skin or the white part of his or her eyes becomes yellow.   Your baby is not gaining weight by 53 days of age. SEEK IMMEDIATE MEDICAL CARE IF:   Your baby is overly tired (lethargic) and does not want to wake up and feed.  Your baby develops an unexplained fever. Document Released: 10/25/2005 Document Revised: 10/30/2013 Document Reviewed: 04/18/2013 [facility] Patient Information  2015 [place], [place]. This information is not intended to  replace advice given to you by your health care provider. Make sure you discuss any questions you have with your health care provider.  

## 2015-07-22 NOTE — Assessment & Plan Note (Signed)
Doing well with no complaints.  Gc/ch done today  - f/u in one week

## 2015-07-22 NOTE — Progress Notes (Signed)
Anne Vaughn is a 19 y.o. G1P0 at [redacted]w[redacted]d for routine follow up.  She reports none.  See flow sheet for details.  A/P: Pregnancy at [redacted]w[redacted]d.  Doing well.   Pregnancy issues include GBS + in urine culture   Infant feeding choice breast feeding  Contraception choice OCP's  Infant circumcision desired boy and yes   Tdapwas not given today. 05/20/15 GC/CZ testing was performed today. GBS + in urine culture   Preterm labor precautions reviewed. Safe sleep discussed. Kick counts reviewed. Follow up 1 week.

## 2015-07-23 ENCOUNTER — Other Ambulatory Visit: Payer: Self-pay | Admitting: Family Medicine

## 2015-07-23 LAB — CERVICOVAGINAL ANCILLARY ONLY
Chlamydia: NEGATIVE
NEISSERIA GONORRHEA: NEGATIVE

## 2015-07-23 MED ORDER — FERROUS SULFATE 325 (65 FE) MG PO TBEC: 325.0000 mg | DELAYED_RELEASE_TABLET | Freq: Two times a day (BID) | ORAL | Status: DC

## 2015-07-31 ENCOUNTER — Ambulatory Visit (INDEPENDENT_AMBULATORY_CARE_PROVIDER_SITE_OTHER): Payer: Medicaid Other | Admitting: Family Medicine

## 2015-07-31 VITALS — BP 130/75 | HR 80 | Temp 98.2°F | Wt 138.9 lb

## 2015-07-31 DIAGNOSIS — Z3403 Encounter for supervision of normal first pregnancy, third trimester: Secondary | ICD-10-CM

## 2015-07-31 NOTE — Progress Notes (Signed)
Anne Vaughn is a 19 y.o. G1P0 at [redacted]w[redacted]d for routine follow up.  She reports no problems.  See flow sheet for details.  A/P: Pregnancy at [redacted]w[redacted]d.  Doing well.   Pregnancy issues include anemia that was discovered at [facility]. Hgb 9.5. Iron sent in but she has not picked up yet.   Infant feeding choice breast feeding  Contraception choice unsure  Infant circumcision desired yes GBS/GC/CZ results were reviewed today.   Labor precautions reviewed. Kick counts reviewed.

## 2015-07-31 NOTE — Assessment & Plan Note (Signed)
No complaints and doing well - results reviewed.  - f/u in one week in OB clinic.

## 2015-07-31 NOTE — Patient Instructions (Signed)
Thank you for coming in,   Please make an appointment in OB in one week.   Sign up for My Chart to have easy access to your labs results, and communication with your Primary care physician   Please feel free to call with any questions or concerns at any time, at [phone]. --Dr. Jordan Likes  Nutrition for the New Mother  A new mother needs good health and nutrition so she can have energy to take care of a new baby. Whether a mother breastfeeds or formula feeds the baby, it is important to have a well-balanced diet. Foods from all the food groups should be chosen to meet the new mother's energy needs and to give her the nutrients needed for repair and healing.  A HEALTHY EATING PLAN The My Pyramid plan for Moms outlines what you should eat to help you and your baby stay healthy. The energy and amount of food you need depends on whether or not you are breastfeeding. If you are breastfeeding you will need more nutrients. If you choose not to breastfeed, your nutrition goal should be to return to a healthy weight. Limiting calories may be needed if you are not breastfeeding.  HOME CARE INSTRUCTIONS   For a personal plan based on your unique needs, see your Registered Dietitian or visit [email or web address].  Eat a variety of foods. The plan below will help guide you. The following chart has a suggested daily meal plan from the My Pyramid for Moms.  Eat a variety of fruits and vegetables.  Eat more dark green and orange vegetables and cooked dried beans.  Make half your grains whole grains. Choose whole instead of refined grains.  Choose low-fat or lean meats and poultry.  Choose low-fat or fat-free dairy products like milk, cheese, or yogurt. Fruits  Breastfeeding: 2 cups  Non-Breastfeeding: 2 cups  What Counts as a serving?  1 cup of fruit or juice.   cup dried fruit. Vegetables  Breastfeeding: 3 cups  Non-Breastfeeding: 2  cups  What Counts as a serving?  1 cup raw or cooked  vegetables.  Juice or 2 cups raw leafy vegetables. Grains  Breastfeeding: 8 oz  Non-Breastfeeding: 6 oz  What Counts as a serving?  1 slice bread.  1 oz ready-to-eat cereal.   cup cooked pasta, rice, or cereal. Meat and Beans  Breastfeeding: 6  oz  Non-Breastfeeding: 5  oz  What Counts as a serving?  1 oz lean meat, poultry, or fish   cup cooked dry beans   oz nuts or 1 egg  1 tbs peanut butter Milk  Breastfeeding: 3 cups  Non-Breastfeeding: 3 cups  What Counts as a serving?  1 cup milk.  8 oz yogurt.  1  oz cheese.  2 oz processed cheese. TIPS FOR THE BREASTFEEDING MOM  Rapid weight loss is not suggested when you are breastfeeding. By simply breastfeeding, you will be able to lose the weight gained during your pregnancy. Your caregiver can keep track of your weight and tell you if your weight loss is appropriate.  Be sure to drink fluids. You may notice that you are thirstier than usual. A suggestion is to drink a glass of water or other beverage whenever you breastfeed.  Avoid alcohol as it can be passed into your breast milk.  Limit caffeine drinks to no more than 2 to 3 cups per day.  You may need to keep taking your prenatal vitamin while you are breastfeeding. Talk with your caregiver about taking  a vitamin or supplement. RETURING TO A HEALTHY WEIGHT  The My Pyramid Plan for Moms will help you return to a healthy weight. It will also provide the nutrients you need.  You may need to limit "empty" calories. These include:  High fat foods like fried foods, fatty meats, fast food, butter, and mayonnaise.  High sugar foods like sodas, jelly, candy, and sweets.  Be physically active. Include 30 minutes of exercise or more each day. Choose an activity you like such as walking, swimming, biking, or aerobics. Check with your caregiver before you start to exercise. Document Released: 02/01/2008 Document Revised: 01/17/2012 Document Reviewed:  02/01/2008 [facility] Patient Information 2015 [place], [place]. This information is not intended to replace advice given to you by your health care provider. Make sure you discuss any questions you have with your health care provider.

## 2015-08-07 ENCOUNTER — Ambulatory Visit (INDEPENDENT_AMBULATORY_CARE_PROVIDER_SITE_OTHER): Payer: Medicaid Other | Admitting: Family Medicine

## 2015-08-07 VITALS — BP 127/71 | HR 102 | Temp 98.8°F | Wt 140.4 lb

## 2015-08-07 DIAGNOSIS — Z3403 Encounter for supervision of normal first pregnancy, third trimester: Secondary | ICD-10-CM

## 2015-08-07 NOTE — Progress Notes (Signed)
Anne Vaughn is a 19 y.o. G1P0 at [redacted]w[redacted]d for routine follow up.  She reports heartburn.  See flow sheet for details.  A/P: Pregnancy at [redacted]w[redacted]d.  Doing well.   Pregnancy issues include heart burn. Taking OTC Tums.  Feeding choice: Breast Contraception choice: OCP Infant circumcision desired yes GBS/GC/CZ results were reviewed today.   Labor precautions reviewed. Kick counts reviewed.  Follow up in 1 week.

## 2015-08-07 NOTE — Patient Instructions (Signed)
Third Trimester of Pregnancy The third trimester is from week 29 through week 42, months 7 through 9. This trimester is when your unborn baby (fetus) is growing very fast. At the end of the ninth month, the unborn baby is about 20 inches in length. It weighs about 6-10 pounds.  HOME CARE   Avoid all smoking, herbs, and alcohol. Avoid drugs not approved by your doctor.  Only take medicine as told by your doctor. Some medicines are safe and some are not during pregnancy.  Exercise only as told by your doctor. Stop exercising if you start having cramps.  Eat regular, healthy meals.  Wear a good support bra if your breasts are tender.  Do not use hot tubs, steam rooms, or saunas.  Wear your seat belt when driving.  Avoid raw meat, uncooked cheese, and liter boxes and soil used by cats.  Take your prenatal vitamins.  Try taking medicine that helps you poop (stool softener) as needed, and if your doctor approves. Eat more fiber by eating fresh fruit, vegetables, and whole grains. Drink enough fluids to keep your pee (urine) clear or pale yellow.  Take warm water baths (sitz baths) to soothe pain or discomfort caused by hemorrhoids. Use hemorrhoid cream if your doctor approves.  If you have puffy, bulging veins (varicose veins), wear support hose. Raise (elevate) your feet for 15 minutes, 3-4 times a day. Limit salt in your diet.  Avoid heavy lifting, wear low heels, and sit up straight.  Rest with your legs raised if you have leg cramps or low back pain.  Visit your dentist if you have not gone during your pregnancy. Use a soft toothbrush to brush your teeth. Be gentle when you floss.  You can have sex (intercourse) unless your doctor tells you not to.  Do not travel far distances unless you must. Only do so with your doctor's approval.  Take prenatal classes.  Practice driving to the hospital.  Pack your hospital bag.  Prepare the baby's room.  Go to your doctor visits. GET  HELP IF:  You are not sure if you are in labor or if your water has broken.  You are dizzy.  You have mild cramps or pressure in your lower belly (abdominal).  You have a nagging pain in your belly area.  You continue to feel sick to your stomach (nauseous), throw up (vomit), or have watery poop (diarrhea).  You have bad smelling fluid coming from your vagina.  You have pain with peeing (urination). GET HELP RIGHT AWAY IF:   You have a fever.  You are leaking fluid from your vagina.  You are spotting or bleeding from your vagina.  You have severe belly cramping or pain.  You lose or gain weight rapidly.  You have trouble catching your breath and have chest pain.  You notice sudden or extreme puffiness (swelling) of your face, hands, ankles, feet, or legs.  You have not felt the baby move in over an hour.  You have severe headaches that do not go away with medicine.  You have vision changes. Document Released: 01/19/2010 Document Revised: 02/19/2013 Document Reviewed: 12/26/2012 [employer] Patient Information 2015 [employer]. This information is not intended to replace advice given to you by your health care provider. Make sure you discuss any questions you have with your health care provider.  

## 2015-08-08 ENCOUNTER — Inpatient Hospital Stay (HOSPITAL_COMMUNITY)
Admission: AD | Admit: 2015-08-08 | Discharge: 2015-08-11 | DRG: 775 | Disposition: A | Discharge status: Home / Self Care | Payer: Medicaid Other | Source: Ambulatory Visit | Attending: Family Medicine | Admitting: Family Medicine

## 2015-08-08 ENCOUNTER — Inpatient Hospital Stay (HOSPITAL_COMMUNITY): Payer: Medicaid Other | Admitting: Anesthesiology

## 2015-08-08 ENCOUNTER — Encounter (HOSPITAL_COMMUNITY): Payer: Self-pay | Admitting: *Deleted

## 2015-08-08 DIAGNOSIS — Z3A39 39 weeks gestation of pregnancy: Secondary | ICD-10-CM

## 2015-08-08 DIAGNOSIS — O99824 Streptococcus B carrier state complicating childbirth: Secondary | ICD-10-CM | POA: Diagnosis not present

## 2015-08-08 DIAGNOSIS — O26893 Other specified pregnancy related conditions, third trimester: Secondary | ICD-10-CM | POA: Diagnosis present

## 2015-08-08 DIAGNOSIS — IMO0001 Reserved for inherently not codable concepts without codable children: Secondary | ICD-10-CM

## 2015-08-08 DIAGNOSIS — Z8249 Family history of ischemic heart disease and other diseases of the circulatory system: Secondary | ICD-10-CM

## 2015-08-08 HISTORY — DX: Anemia, unspecified: D64.9

## 2015-08-08 LAB — URINALYSIS, ROUTINE W REFLEX MICROSCOPIC
Bilirubin Urine: NEGATIVE
GLUCOSE, UA: NEGATIVE mg/dL
Ketones, ur: NEGATIVE mg/dL
Nitrite: NEGATIVE
PROTEIN: NEGATIVE mg/dL
SPECIFIC GRAVITY, URINE: 1.01 (ref 1.005–1.030)
Urobilinogen, UA: 1 mg/dL (ref 0.0–1.0)
pH: 7.5 (ref 5.0–8.0)

## 2015-08-08 LAB — CBC
HCT: 29.4 % — ABNORMAL LOW (ref 36.0–46.0)
Hemoglobin: 9.2 g/dL — ABNORMAL LOW (ref 12.0–15.0)
MCH: 24.3 pg — ABNORMAL LOW (ref 26.0–34.0)
MCHC: 31.3 g/dL (ref 30.0–36.0)
MCV: 77.6 fL — AB (ref 78.0–100.0)
PLATELETS: 300 10*3/uL (ref 150–400)
RBC: 3.79 MIL/uL — ABNORMAL LOW (ref 3.87–5.11)
RDW: 15.9 % — AB (ref 11.5–15.5)
WBC: 9.9 10*3/uL (ref 4.0–10.5)

## 2015-08-08 LAB — URINE MICROSCOPIC-ADD ON

## 2015-08-08 LAB — TYPE AND SCREEN
ABO/RH(D): B POS
Antibody Screen: NEGATIVE

## 2015-08-08 LAB — ABO/RH: ABO/RH(D): B POS

## 2015-08-08 MED ORDER — CITRIC ACID-SODIUM CITRATE 334-500 MG/5ML PO SOLN: 30.0000 mL | ORAL | Status: DC | PRN

## 2015-08-08 MED ORDER — ACETAMINOPHEN 325 MG PO TABS: 650.0000 mg | ORAL_TABLET | ORAL | Status: DC | PRN

## 2015-08-08 MED ORDER — EPHEDRINE 5 MG/ML INJ: 10.0000 mg | INTRAVENOUS | Status: DC | PRN

## 2015-08-08 MED ORDER — FENTANYL CITRATE (PF) 100 MCG/2ML IJ SOLN
50.0000 ug | INTRAMUSCULAR | Status: DC | PRN
  Administered 2015-08-08 (×2): 100 ug via INTRAVENOUS
  Filled 2015-08-08 (×3): qty 2

## 2015-08-08 MED ORDER — FERROUS SULFATE 325 (65 FE) MG PO TABS
325.0000 mg | ORAL_TABLET | Freq: Two times a day (BID) | ORAL | Status: DC
  Administered 2015-08-08: 325 mg via ORAL
  Filled 2015-08-08 (×2): qty 1

## 2015-08-08 MED ORDER — DIPHENHYDRAMINE HCL 50 MG/ML IJ SOLN: 12.5000 mg | INTRAMUSCULAR | Status: DC | PRN

## 2015-08-08 MED ORDER — PHENYLEPHRINE 40 MCG/ML (10ML) SYRINGE FOR IV PUSH (FOR BLOOD PRESSURE SUPPORT)
80.0000 ug | PREFILLED_SYRINGE | INTRAVENOUS | Status: DC | PRN
  Filled 2015-08-08: qty 20

## 2015-08-08 MED ORDER — TERBUTALINE SULFATE 1 MG/ML IJ SOLN: 0.2500 mg | Freq: Once | INTRAMUSCULAR | Status: DC | PRN

## 2015-08-08 MED ORDER — OXYTOCIN 40 UNITS IN LACTATED RINGERS INFUSION - SIMPLE MED
1.0000 m[IU]/min | INTRAVENOUS | Status: DC
Start: 2015-08-08 — End: 2015-08-09
  Administered 2015-08-08: 2 m[IU]/min via INTRAVENOUS
  Filled 2015-08-08: qty 1000

## 2015-08-08 MED ORDER — LIDOCAINE HCL (PF) 1 % IJ SOLN
30.0000 mL | INTRAMUSCULAR | Status: DC | PRN
  Filled 2015-08-08: qty 30

## 2015-08-08 MED ORDER — LACTATED RINGERS IV SOLN
500.0000 mL | INTRAVENOUS | Status: DC | PRN
  Administered 2015-08-08: 500 mL via INTRAVENOUS

## 2015-08-08 MED ORDER — OXYCODONE-ACETAMINOPHEN 5-325 MG PO TABS: 2.0000 | ORAL_TABLET | ORAL | Status: DC | PRN

## 2015-08-08 MED ORDER — OXYTOCIN 40 UNITS IN LACTATED RINGERS INFUSION - SIMPLE MED: 62.5000 mL/h | INTRAVENOUS | Status: DC

## 2015-08-08 MED ORDER — LACTATED RINGERS IV SOLN
INTRAVENOUS | Status: DC
  Administered 2015-08-08 (×2): via INTRAVENOUS

## 2015-08-08 MED ORDER — FENTANYL 2.5 MCG/ML BUPIVACAINE 1/10 % EPIDURAL INFUSION (WH - ANES)
14.0000 mL/h | INTRAMUSCULAR | Status: DC | PRN
  Filled 2015-08-08: qty 125

## 2015-08-08 MED ORDER — OXYTOCIN BOLUS FROM INFUSION: 500.0000 mL | INTRAVENOUS | Status: DC

## 2015-08-08 MED ORDER — DEXTROSE 5 % IV SOLN
2.5000 10*6.[IU] | INTRAVENOUS | Status: DC
  Administered 2015-08-08 (×2): 2.5 10*6.[IU] via INTRAVENOUS
  Filled 2015-08-08 (×6): qty 2.5

## 2015-08-08 MED ORDER — ONDANSETRON HCL 4 MG/2ML IJ SOLN: 4.0000 mg | Freq: Four times a day (QID) | INTRAMUSCULAR | Status: DC | PRN

## 2015-08-08 MED ORDER — OXYCODONE-ACETAMINOPHEN 5-325 MG PO TABS: 1.0000 | ORAL_TABLET | ORAL | Status: DC | PRN

## 2015-08-08 MED ORDER — PENICILLIN G POTASSIUM 5000000 UNITS IJ SOLR
5.0000 10*6.[IU] | Freq: Once | INTRAVENOUS | Status: AC
  Administered 2015-08-08: 5 10*6.[IU] via INTRAVENOUS
  Filled 2015-08-08: qty 5

## 2015-08-08 NOTE — MAU Note (Signed)
Pt states she has been having U/C's since 2300 last night.  Now contractions are 5-15 minutes apart.  Pt states she passed her mucous plug.  Denies vaginal bleeding, ROM, or abnormal vaginal discharge.  Good fetal movement.

## 2015-08-08 NOTE — H&P (Signed)
OBSTETRIC ADMISSION HISTORY AND PHYSICAL  Anne Vaughn is a 19 y.o. female G1P0 with IUP at [redacted]w[redacted]d by LMP presenting for active labor at term. She reports +FMs, No LOF, no VB, no blurry vision, headaches or peripheral edema, and RUQ pain.  She plans on breast feeding. She request POPs for birth control.  Dating: By LMP/ c/w 18w u/s --->  Estimated Date of Delivery: 08/15/15  Sono:    , CWD, normal anatomy, cephalic presentation, longitudinal lie, 307g, 63% EFW , CWD, normal anatomy, cephalic presentation, longitudinal lie, 552g, 55% EFW   Prenatal History/Complications: [facility] Prenatal Labs  Dating LMP Blood type: B pos  Genetic Screen Quad: Negative  Antibody:   Anatomic [place] Normal  Rubella: 3.17  GTT Early: Third trimester: 85 RPR: NR  Flu vaccine  HBsAg:   TDaP vaccine  05/20/15 Rhogam: HIV: NR  GBS Positive in urine  GBS: + urine cx  Contraception OCP's    Baby Food    Circumcision Yes    Pediatrician    Support Person        Past Medical History: Past Medical History  Diagnosis Date  . Anemia     Past Surgical History: Past Surgical History  Procedure Laterality Date  . No past surgeries      Obstetrical History: OB History    Gravida Para Term Preterm AB TAB SAB Ectopic Multiple Living   1         0      Social History: Social History   Social History  . Marital Status: Single    Spouse Name: N/A  . Number of Children: N/A  . Years of Education: N/A   Social History Main Topics  . Smoking status: Never Smoker   . Smokeless tobacco: Never Used  . Alcohol Use: No  . Drug Use: No  . Sexual Activity: Yes   Other Topics Concern  . None   Social History Narrative    Family History: Family History  Problem Relation Age of Onset  . Hypertension Mother     Allergies: No Known Allergies  Prescriptions prior to admission  Medication Sig Dispense  Refill Last Dose  . ferrous sulfate 325 (65 FE) MG EC tablet Take 1 tablet (325 mg total) by mouth 2 (two) times daily between meals. 60 tablet 1   . fluticasone (FLONASE) 50 MCG/ACT nasal spray Place 2 sprays into both nostrils daily. 16 g 6   . prenatal vitamin w/FE, FA (PRENATAL 1 + 1) 27-1 MG TABS tablet Take 1 tablet by mouth daily at 12 noon.   Taking     Review of Systems   All systems reviewed and negative except as stated in HPI  Blood pressure 139/86, pulse 69, temperature 98.7 F (37.1 C), temperature source Oral, resp. rate 18, last menstrual period 11/08/2014, SpO2 98 %. General appearance: alert, cooperative, appears stated age and mild distress Lungs: clear to auscultation bilaterally Heart: regular rate and rhythm Abdomen: soft, non-tender; bowel sounds normal Pelvic: 5/100/0 Extremities: Homans sign is negative, no sign of DVT DTR's wnl Presentation: cephalic Fetal monitoringBaseline: 130 bpm, Variability: Good {> 6 bpm), Accelerations: Reactive and Decelerations: one deep late decel in MAU, none since then Uterine activityFrequency: Every 2-6 minutes Dilation: 5 Effacement (%): 100 Station: 0 Exam by:: [email or web address],[email or web address]-ob   Prenatal labs: ABO, Rh: B/POS/-- (02/19 1016) Antibody: NEG (02/19 1016) Rubella:  immune RPR: NON REAC (07/12 1015)  HBsAg: NEGATIVE (02/19 1016)  HIV: NONREACTIVE (07/12 1015)  GBS: Positive (02/19 0000)  1 hr Glucola 85 Genetic screening  Negative Anatomy US WNL  Prenatal Transfer Tool  Maternal Diabetes: No Genetic Screening: Normal Maternal Ultrasounds/Referrals: Normal Fetal Ultrasounds or other Referrals:  None Maternal Substance Abuse:  No Significant Maternal Medications:  Meds include: Other: Iron Significant Maternal Lab Results: Lab values include: Group B Strep positive  No results found for this or any previous visit (from the past 24 hour(s)).  Patient Active Problem List   Diagnosis Date Noted  . Active labor at  term 08/08/2015  . Screening, antenatal, for fetal anatomic survey   . [redacted] weeks gestation of pregnancy   . Supervision of normal first pregnancy 12/10/2014  . Xerosis of skin 05/16/2013  . Iron deficiency anemia 01/27/2011    Assessment: Anne Vaughn is a 19 y.o. G1P0 at [redacted]w[redacted]d here for active labor at term  #Labor: Expectant management #Pain: Plan for epidural upon maternal request #FWB: Category 1 tracing #ID:  GBS+, PCN #MOF: breast #MOC:POPs, would like more info on other contraceptive options postpartum #Circ:  outpt circ, will need list of places/prices  Durenda Hurt, DO 08/08/2015, 1:45 PM   OB FELLOW HISTORY AND PHYSICAL ATTESTATION  I have seen and examined this patient; I agree with above documentation in the resident's note.   Cherrie Gauze Wouk 08/08/2015, 4:48 PM

## 2015-08-09 ENCOUNTER — Encounter (HOSPITAL_COMMUNITY): Payer: Self-pay

## 2015-08-09 DIAGNOSIS — O99824 Streptococcus B carrier state complicating childbirth: Secondary | ICD-10-CM

## 2015-08-09 DIAGNOSIS — Z3A39 39 weeks gestation of pregnancy: Secondary | ICD-10-CM

## 2015-08-09 DIAGNOSIS — Z8249 Family history of ischemic heart disease and other diseases of the circulatory system: Secondary | ICD-10-CM

## 2015-08-09 LAB — RPR: RPR: NONREACTIVE

## 2015-08-09 MED ORDER — FENTANYL 2.5 MCG/ML BUPIVACAINE 1/10 % EPIDURAL INFUSION (WH - ANES)
INTRAMUSCULAR | Status: DC | PRN
  Administered 2015-08-08: 14 mL/h via EPIDURAL

## 2015-08-09 MED ORDER — TETANUS-DIPHTH-ACELL PERTUSSIS 5-2.5-18.5 LF-MCG/0.5 IM SUSP: 0.5000 mL | Freq: Once | INTRAMUSCULAR | Status: DC

## 2015-08-09 MED ORDER — ACETAMINOPHEN 325 MG PO TABS: 650.0000 mg | ORAL_TABLET | ORAL | Status: DC | PRN

## 2015-08-09 MED ORDER — OXYCODONE-ACETAMINOPHEN 5-325 MG PO TABS: 1.0000 | ORAL_TABLET | ORAL | Status: DC | PRN

## 2015-08-09 MED ORDER — ONDANSETRON HCL 4 MG PO TABS: 4.0000 mg | ORAL_TABLET | ORAL | Status: DC | PRN

## 2015-08-09 MED ORDER — ONDANSETRON HCL 4 MG/2ML IJ SOLN: 4.0000 mg | INTRAMUSCULAR | Status: DC | PRN

## 2015-08-09 MED ORDER — SENNOSIDES-DOCUSATE SODIUM 8.6-50 MG PO TABS
2.0000 | ORAL_TABLET | ORAL | Status: DC
  Administered 2015-08-09 – 2015-08-10 (×2): 2 via ORAL
  Filled 2015-08-09 (×2): qty 2

## 2015-08-09 MED ORDER — DIPHENHYDRAMINE HCL 25 MG PO CAPS: 25.0000 mg | ORAL_CAPSULE | Freq: Four times a day (QID) | ORAL | Status: DC | PRN

## 2015-08-09 MED ORDER — BENZOCAINE-MENTHOL 20-0.5 % EX AERO: 1.0000 "application " | INHALATION_SPRAY | CUTANEOUS | Status: DC | PRN

## 2015-08-09 MED ORDER — IBUPROFEN 600 MG PO TABS
600.0000 mg | ORAL_TABLET | Freq: Four times a day (QID) | ORAL | Status: DC
  Administered 2015-08-09 – 2015-08-11 (×10): 600 mg via ORAL
  Filled 2015-08-09 (×10): qty 1

## 2015-08-09 MED ORDER — SIMETHICONE 80 MG PO CHEW: 80.0000 mg | CHEWABLE_TABLET | ORAL | Status: DC | PRN

## 2015-08-09 MED ORDER — LANOLIN HYDROUS EX OINT: TOPICAL_OINTMENT | CUTANEOUS | Status: DC | PRN

## 2015-08-09 MED ORDER — PRENATAL MULTIVITAMIN CH
1.0000 | ORAL_TABLET | Freq: Every day | ORAL | Status: DC
  Administered 2015-08-09 – 2015-08-10 (×2): 1 via ORAL
  Filled 2015-08-09 (×2): qty 1

## 2015-08-09 MED ORDER — BUPIVACAINE HCL (PF) 0.25 % IJ SOLN
INTRAMUSCULAR | Status: DC | PRN
  Administered 2015-08-08 (×2): 4 mL via EPIDURAL

## 2015-08-09 MED ORDER — WITCH HAZEL-GLYCERIN EX PADS: 1.0000 "application " | MEDICATED_PAD | CUTANEOUS | Status: DC | PRN

## 2015-08-09 MED ORDER — LIDOCAINE-EPINEPHRINE (PF) 2 %-1:200000 IJ SOLN
INTRAMUSCULAR | Status: DC | PRN
  Administered 2015-08-08: 4 mL

## 2015-08-09 MED ORDER — OXYCODONE-ACETAMINOPHEN 5-325 MG PO TABS: 2.0000 | ORAL_TABLET | ORAL | Status: DC | PRN

## 2015-08-09 MED ORDER — ZOLPIDEM TARTRATE 5 MG PO TABS: 5.0000 mg | ORAL_TABLET | Freq: Every evening | ORAL | Status: DC | PRN

## 2015-08-09 MED ORDER — DIBUCAINE 1 % RE OINT: 1.0000 "application " | TOPICAL_OINTMENT | RECTAL | Status: DC | PRN

## 2015-08-09 NOTE — Anesthesia Postprocedure Evaluation (Signed)
Anesthesia Post Note  Patient: Anne Vaughn  Procedure(s) Performed: * No procedures listed *  Anesthesia type: Epidural  Patient location: Mother/Baby  Post pain: Pain level controlled  Post assessment: Post-op Vital signs reviewed  Last Vitals:  Filed Vitals:   08/09/15 0750  BP: 119/65  Pulse: 84  Temp: 37.5 C  Resp: 16    Post vital signs: Reviewed  Level of consciousness:alert  Complications: No apparent anesthesia complications

## 2015-08-09 NOTE — Anesthesia Preprocedure Evaluation (Signed)
Anesthesia Evaluation  Patient identified by MRN, date of birth, ID band Patient awake    Reviewed: Allergy & Precautions, Patient's Chart, lab work & pertinent test results  History of Anesthesia Complications Negative for: history of anesthetic complications  Airway Mallampati: I  TM Distance: >3 FB Neck ROM: Full    Dental  (+) Teeth Intact   Pulmonary neg pulmonary ROS,    breath sounds clear to auscultation       Cardiovascular negative cardio ROS   Rhythm:Regular     Neuro/Psych negative neurological ROS  negative psych ROS   GI/Hepatic negative GI ROS, Neg liver ROS,   Endo/Other  negative endocrine ROS  Renal/GU negative Renal ROS     Musculoskeletal   Abdominal   Peds  Hematology  (+) anemia ,   Anesthesia Other Findings   Reproductive/Obstetrics (+) Pregnancy                             Anesthesia Physical Anesthesia Plan  ASA: II  Anesthesia Plan: Epidural   Post-op Pain Management:    Induction:   Airway Management Planned:   Additional Equipment:   Intra-op Plan:   Post-operative Plan:   Informed Consent: I have reviewed the patients History and Physical, chart, labs and discussed the procedure including the risks, benefits and alternatives for the proposed anesthesia with the patient or authorized representative who has indicated his/her understanding and acceptance.   Dental advisory given  Plan Discussed with: Anesthesiologist  Anesthesia Plan Comments:         Anesthesia Quick Evaluation  

## 2015-08-09 NOTE — Anesthesia Procedure Notes (Signed)
Epidural Patient location during procedure: OB  Staffing Anesthesiologist: Verlan Grotz Performed by: anesthesiologist   Preanesthetic Checklist Completed: patient identified, surgical consent, pre-op evaluation, timeout performed, IV checked, risks and benefits discussed and monitors and equipment checked  Epidural Patient position: sitting Prep: DuraPrep Patient monitoring: heart rate, cardiac monitor, continuous pulse ox and blood pressure Approach: midline Location: L3-L4 Injection technique: LOR saline  Needle:  Needle type: Tuohy  Needle gauge: 17 G Needle length: 9 cm Needle insertion depth: 6 cm Catheter type: closed end flexible Catheter size: 19 Gauge Catheter at skin depth: 11 cm Test dose: negative and 2% lidocaine with Epi 1:200 K  Assessment Events: blood not aspirated, injection not painful, no injection resistance, negative IV test and no paresthesia  Additional Notes Reason for block:procedure for pain   

## 2015-08-09 NOTE — Progress Notes (Signed)
Delivery of live viable female by Philipp Deputy, CNM, APGARS 8,9

## 2015-08-09 NOTE — Lactation Note (Signed)
This note was copied from the chart of Anne Vaughn. Lactation Consultation Note  Patient Name: Anne Shanyiah Conde RUEAV'W Date: 08/09/2015 Reason for consult: Initial assessment    With this first time mom and term baby, now 8 hours old. The baby has already stooled and voided more than once each. I assisted mom with latching, and the baby latched easily, deeply with strong, rhythmic suckles. Mom denies any discomfort with latch. i showed mom how to use cross cradle as apposed to cradle, to obtain a deeper latch.Dad very involved and helpful.  Lactation services also reviewed, as well as breast feeding pages in the baby and me book. Mom knows to call for questions/concerns.   Maternal Data Formula Feeding for Exclusion: No Has patient been taught Hand Expression?: Yes Does the patient have breastfeeding experience prior to this delivery?: No  Feeding Feeding Type: Breast Fed  LATCH Score/Interventions Latch: Grasps breast easily, tongue down, lips flanged, rhythmical sucking. Intervention(s): Skin to skin;Teach feeding cues;Waking techniques  Audible Swallowing: A few with stimulation  Type of Nipple: Everted at rest and after stimulation  Comfort (Breast/Nipple): Soft / non-tender     Hold (Positioning): Assistance needed to correctly position infant at breast and maintain latch. Intervention(s): Breastfeeding basics reviewed;Support Pillows;Position options;Skin to skin  LATCH Score: 8  Lactation Tools Discussed/Used     Consult Status Consult Status: Follow-up Date: 08/10/15 Follow-up type: In-patient    Alfred Levins 08/09/2015, 2:00 PM

## 2015-08-09 NOTE — Progress Notes (Signed)
PT UP TO BATHROOM WITH RN ASSIST AND PT ASSISTED WITH PERINEAL CARE. PT BECAME DIZZY AND LIGHTHEADED. RN IMMEDIATELY SAT PT DOWN ON COMMODE FOR 5 -10 MINUTES. PT NEVER LOST CONSCIOUSNESS OR PASSES OUT. PT STOOD AND AMBULATED BACK TO BED . PT INSTRUCTED TO NOT GET UP TO GO TO BATHROOM WITH OUT CALLING FOR ASSISTANCE.

## 2015-08-10 NOTE — Lactation Note (Signed)
This note was copied from the chart of Anne Elzada Pytel. Lactation Consultation Note  Patient Name: Anne Vaughn ZOXWR'U Date: 08/10/2015 Reason for consult: Follow-up assessment  Baby was latched upon entry. Mom reports using  breast Friend pillow makes feeding easier. She just got a DEBP and was asking when she could start pumping to feed. Went over milk transition, belly size, feeding frequency, and breast care. Discussed engorgement treatment/prevention, O/P lactation, and support group.    Maternal Data    Feeding Feeding Type: Breast Fed Length of feed: 20 min  LATCH Score/Interventions Latch: Grasps breast easily, tongue down, lips flanged, rhythmical sucking. Intervention(s): Skin to skin;Teach feeding cues  Audible Swallowing: Spontaneous and intermittent  Type of Nipple: Everted at rest and after stimulation  Comfort (Breast/Nipple): Soft / non-tender     Hold (Positioning): No assistance needed to correctly position infant at breast. Intervention(s): Support Pillows;Position options  LATCH Score: 10  Lactation Tools Discussed/Used     Consult Status Consult Status: Follow-up Date: 08/11/15 Follow-up type: In-patient    Anne Vaughn 08/10/2015, 9:40 PM

## 2015-08-10 NOTE — Progress Notes (Signed)
Post Partum Day 1  Subjective:  Anne Vaughn is a 19 y.o. G1P1001 [redacted]w[redacted]d s/p NSVD.  No acute events overnight.  Pt denies problems with ambulating, voiding or po intake.  She denies nausea or vomiting.  Pain is well controlled.  She has had flatus. She has not had bowel movement.  Lochia Minimal.  Plan for birth control is OCPs.  Method of Feeding: breast  Objective: BP 99/55 mmHg  Pulse 90  Temp(Src) 97.3 F (36.3 C) (Oral)  Resp 20  Ht  (1.676 m)  Wt 63.504 kg (140 lb)  BMI 22.61 kg/m2  SpO2 100%  LMP 11/08/2014 (Exact Date)  Breastfeeding? Unknown  Physical Exam:  General: alert, cooperative and no distress Lochia:normal flow Chest: CTAB Heart: RRR no m/r/g Abdomen: +BS, soft, nontender, fundus firm below umbilicus DVT Evaluation: No evidence of DVT seen on physical exam. Extremities: no edema bilaterally   Recent Labs  08/08/15 1337  HGB 9.2*  HCT 29.4*    Assessment/Plan:  ASSESSMENT: Anne Vaughn is a 19 y.o. G1P1001 [redacted]w[redacted]d ppd #1 s/p NSVD doing well.   Plan for discharge tomorrow, Breastfeeding, Lactation consult and Contraception OCPs   LOS: 2 days   Mickie Hillier 08/10/2015, 8:33 AM  i assessed and agree with this assessment

## 2015-08-10 NOTE — Lactation Note (Signed)
This note was copied from the chart of Boy Taysia Rivere. Lactation Consultation Note  Patient Name: Boy Chenita Ruda JXBJY'[id number] Date: 08/10/2015 Reason for consult: Follow-up assessment   With this first time mom and term baby, last evening at 3% weight loss, now under 6 pounds at 5 lbs 13.7 oz( last night(. Mom has begun supplementing her breastfeeding with EBM by hand pump, feeding with spon. I told mom that her supplementing is not really necessary at this time. But it is fine if this is what she wants to do. Mom's breasts are full, with easily expressed transitional milk. Mom knows to call for questions/concerns.    Maternal Data    Feeding Feeding Type: Breast Fed  LATCH Score/Interventions Latch: Grasps breast easily, tongue down, lips flanged, rhythmical sucking. Intervention(s): Skin to skin  Audible Swallowing: Spontaneous and intermittent  Type of Nipple: Everted at rest and after stimulation  Comfort (Breast/Nipple): Soft / non-tender     Hold (Positioning): No assistance needed to correctly position infant at breast.  LATCH Score: 10  Lactation Tools Discussed/Used     Consult Status Consult Status: PRN Follow-up type: Call as needed    Alfred Levins 08/10/2015, 3:00 PM

## 2015-08-10 NOTE — Progress Notes (Signed)
Patient took a shower. She called out to report she felt dizzy/light headed. She was back in the bed at this time and her family was with her. Encouraged patient to get up slowly and not to bend over.  Her vital signs were all within normal limits. BP-113/66, T-98.7  P-87, RR-20.

## 2015-08-11 MED ORDER — IBUPROFEN 600 MG PO TABS: 600.0000 mg | ORAL_TABLET | Freq: Four times a day (QID) | ORAL | Status: DC

## 2015-08-11 NOTE — Lactation Note (Signed)
This note was copied from the chart of Anne Vaughn. Lactation Consultation Note  Patient Name: Anne Vaughn ZOXWR'U Date: 08/11/2015 Reason for consult: Follow-up assessment Baby 56 hours old. Mom reports that she is becoming engorged, and has been using ice. Mom has personal DEBP in the room. Discussed engorgement prevention/treatment. Referred parents to Baby and Me booklet for EBM storage guidelines and number of diapers to expect by day of life. Discussed nursing/pumping and returning to work/school. Parents aware of OP/BFSG and LC phone line assistance after D/C.   Maternal Data    Feeding    LATCH Score/Interventions                      Lactation Tools Discussed/Used     Consult Status Consult Status: PRN    Geralynn Ochs 08/11/2015, 9:02 AM

## 2015-08-11 NOTE — Discharge Summary (Signed)
OB Discharge Summary  Patient Name: Anne Vaughn DOB: 1996-07-27 MRN: [id number]  Date of admission: 08/08/2015 Delivering MD: Cam Hai D   Date of discharge: 08/11/2015  Admitting diagnosis: 39 WKS, CTX, LOST MUCUS PLUS Intrauterine pregnancy: [redacted]w[redacted]d     Secondary diagnosis: None     Discharge diagnosis: Term Pregnancy Delivered                                                                                                Post partum procedures:none  Augmentation: none  Complications: None  Hospital course:  Onset of Labor With Vaginal Delivery     19 y.o. yo G1P1001 at [redacted]w[redacted]d was admitted in Active Laboron 08/08/2015. Patient had an uncomplicated labor course as follows:  Membrane Rupture Time/Date: 10:30 PM ,08/08/2015   Intrapartum Procedures: Episiotomy: None [1]                                         Lacerations:     Patient had a delivery of a Viable infant. 08/09/2015  Information for the patient's newborn:  Lucresha, Dismuke [[id number]]  Delivery Method: Vag-Spont    Pateint had an uncomplicated postpartum course.  She is ambulating, tolerating a regular diet, passing flatus, and urinating well. Patient is discharged home in stable condition on No discharge date for patient encounter.Marland Kitchen    Physical exam  Filed Vitals:   08/10/15 0753 08/10/15 1315 08/10/15 1805 08/11/15 0600  BP: 99/55 113/66 118/61 107/61  Pulse: 90 87 84 76  Temp: 97.3 F (36.3 C) 98.7 F (37.1 C) 98.9 F (37.2 C) 98.1 F (36.7 C)  TempSrc: Oral Oral Oral   Resp: Height:      Weight:      SpO2: 100%  100%    General: alert, cooperative and no distress Lochia: appropriate Uterine Fundus: firm Incision: N/A DVT Evaluation: No evidence of DVT seen on physical exam. Negative Homan's sign. No cords or calf tenderness. Labs: Lab Results  Component Value Date   WBC 9.9 08/08/2015   HGB 9.2* 08/08/2015   HCT 29.4* 08/08/2015   MCV 77.6* 08/08/2015    PLT 300 08/08/2015   No flowsheet data found.  Discharge instruction: per After Visit Summary and "Baby and Me Booklet".  Medications:  Current facility-administered medications:  .  acetaminophen (TYLENOL) tablet 650 mg, 650 mg, Oral, Q4H PRN, Arabella Merles, CNM .  benzocaine-Menthol (DERMOPLAST) 20-0.5 % topical spray 1 application, 1 application, Topical, PRN, Arabella Merles, CNM .  witch hazel-glycerin (TUCKS) pad 1 application, 1 application, Topical, PRN **AND** dibucaine (NUPERCAINAL) 1 % rectal ointment 1 application, 1 application, Rectal, PRN, Arabella Merles, CNM .  diphenhydrAMINE (BENADRYL) capsule 25 mg, 25 mg, Oral, Q6H PRN, Arabella Merles, CNM .  ibuprofen (ADVIL,MOTRIN) tablet 600 mg, 600 mg, Oral, 4 times per day, Arabella Merles, CNM, 600 mg at 08/11/15 0602 .  lanolin ointment, , Topical, PRN, Marcelle Smiling  Clelia Croft, CNM .  ondansetron (ZOFRAN) tablet 4 mg, 4 mg, Oral, Q4H PRN **OR** ondansetron (ZOFRAN) injection 4 mg, 4 mg, Intravenous, Q4H PRN, Arabella Merles, CNM .  oxyCODONE-acetaminophen (PERCOCET/ROXICET) 5-325 MG per tablet 1 tablet, 1 tablet, Oral, Q4H PRN, Arabella Merles, CNM .  oxyCODONE-acetaminophen (PERCOCET/ROXICET) 5-325 MG per tablet 2 tablet, 2 tablet, Oral, Q4H PRN, Arabella Merles, CNM .  prenatal multivitamin tablet 1 tablet, 1 tablet, Oral, Q1200, Arabella Merles, CNM, 1 tablet at 08/10/15 1224 .  senna-docusate (Senokot-S) tablet 2 tablet, 2 tablet, Oral, Q24H, Arabella Merles, CNM, 2 tablet at 08/10/15 2344 .  simethicone (MYLICON) chewable tablet 80 mg, 80 mg, Oral, PRN, Arabella Merles, CNM .  Tdap (BOOSTRIX) injection 0.5 mL, 0.5 mL, Intramuscular, Once, Arabella Merles, CNM, 0.5 mL at 08/10/15 1027 .  zolpidem (AMBIEN) tablet 5 mg, 5 mg, Oral, QHS PRN, Arabella Merles, CNM  Facility-Administered Medications Ordered in Other Encounters:  .  bupivacaine (PF) (MARCAINE) 0.25 % injection, , , Anesthesia Intra-op, Val Eagle, MD, 4 mL at  08/08/15 2035 .  fentaNYL 2.5 mcg/ml w/bupivacaine 0.1% in NS epidural infusion (WH-ANES), , , Continuous PRN, Val Eagle, MD, Last Rate: 14 mL/hr at 08/08/15 2035, 14 mL/hr at 08/08/15 2035 .  lidocaine-EPINEPHrine (XYLOCAINE W/EPI) 2 %-1:200000 (PF) injection, , Other, Anesthesia Intra-op, Val Eagle, MD, 4 mL at 08/08/15 2027  Diet: routine diet  Activity: Advance as tolerated. Pelvic rest for 6 weeks.   Outpatient follow up:6 weeks  Postpartum contraception: Progesterone only pills  Newborn Data: Live born female  Birth Weight: 6 lb 1 oz (2750 g) APGAR: 9, 10  Baby Feeding: Breast Disposition:home with mother   08/11/2015 Ferdie Ping, CNM

## 2015-08-15 ENCOUNTER — Encounter: Payer: Medicaid Other | Admitting: Family Medicine

## 2015-09-12 ENCOUNTER — Telehealth: Payer: Self-pay | Admitting: Family Medicine

## 2015-09-12 NOTE — Telephone Encounter (Signed)
Pt needs provider to fill out FMLA form provided by employer to process her maternity leave. Pt states that provider forgot to sign the  original FMLA paperwork so this is needed promptly. In haste, Dorothey BasemanSadie Reynolds, ASA

## 2015-09-12 NOTE — Telephone Encounter (Signed)
Anne Vaughn will place in MDs box. [name], [name]

## 2015-09-19 NOTE — Telephone Encounter (Signed)
Form completed and to be faxed.   Myra RudeJeremy E Schmitz, MD PGY-3, [facility] 09/19/2015, 2:15 PM

## 2015-09-22 ENCOUNTER — Ambulatory Visit (INDEPENDENT_AMBULATORY_CARE_PROVIDER_SITE_OTHER): Payer: Medicaid Other | Admitting: Family Medicine

## 2015-09-22 NOTE — Assessment & Plan Note (Signed)
Doing well with no concerns She is currently not taking any birth control She will consider a LARC going forward.

## 2015-09-22 NOTE — Patient Instructions (Signed)
Thank you for coming in,   Please follow up with me if you want to consider a different form of birth control   Sign up for My Chart to have easy access to your labs results, and communication with your Primary care physician   Please feel free to call with any questions or concerns at any time, at [phone]. --Dr. Jordan LikesSchmitz

## 2015-09-22 NOTE — Progress Notes (Signed)
   Subjective:    Anne Vaughn - 19 y.o. female MRN [id number]  Date of birth: 06/17/1996  HPI  Anne Vaughn is here for postpartum follow up.  Postpartum:  Feeling well   She hasn't started taking birth control  FMLA paperwork was filled out for her maternity leave.  Denies any pain.    Health Maintenance:  Health Maintenance Due  Topic Date Due  . INFLUENZA VACCINE  06/09/2015    -  reports that she has never smoked. She has never used smokeless tobacco. - Review of Systems: Per HPI. - Past Medical History: Patient Active Problem List   Diagnosis Date Noted  . Routine postpartum follow-up 09/22/2015  . Iron deficiency anemia 01/27/2011   - Medications: reviewed and updated Current Outpatient Prescriptions  Medication Sig Dispense Refill  . Ca Carbonate-Mag Hydroxide (ROLAIDS PO) Take 1 tablet by mouth daily as needed (for heartburn).    . fluticasone (FLONASE) 50 MCG/ACT nasal spray Place 2 sprays into both nostrils daily. 16 g 6  . ibuprofen (ADVIL,MOTRIN) 600 MG tablet Take 1 tablet (600 mg total) by mouth every 6 (six) hours. 30 tablet 0   No current facility-administered medications for this visit.     Review of Systems See HPI     Objective:   Physical Exam BP 118/58 mmHg  Pulse 72  Temp(Src) 98.3 F (36.8 C) (Oral)  Ht 5\' 6"  (1.676 m)  Wt 117 lb 14.4 oz (53.479 kg)  BMI 19.04 kg/m2  SpO2 97% Gen: NAD, alert, cooperative with exam, well-appearing Skin: no rashes, normal turgor  Neuro: no gross deficits.      Assessment & Plan:   Routine postpartum follow-up Doing well with no concerns She is currently not taking any birth control She will consider a LARC going forward.

## 2015-10-14 ENCOUNTER — Encounter: Payer: Self-pay | Admitting: Family Medicine

## 2015-12-30 ENCOUNTER — Encounter: Payer: Self-pay | Admitting: Family Medicine

## 2015-12-30 ENCOUNTER — Ambulatory Visit (INDEPENDENT_AMBULATORY_CARE_PROVIDER_SITE_OTHER): Payer: Medicaid Other | Admitting: Family Medicine

## 2015-12-30 VITALS — BP 122/82 | HR 99 | Temp 99.5°F | Wt 118.0 lb

## 2015-12-30 DIAGNOSIS — J029 Acute pharyngitis, unspecified: Secondary | ICD-10-CM

## 2015-12-30 DIAGNOSIS — J069 Acute upper respiratory infection, unspecified: Secondary | ICD-10-CM

## 2015-12-30 LAB — POCT RAPID STREP A (OFFICE): RAPID STREP A SCREEN: NEGATIVE

## 2015-12-30 NOTE — Progress Notes (Signed)
Patient ID: Anne Vaughn, female   DOB: 12-03-1995, 20 y.o.   MRN: [id number]   [facility] Yolande Jolly, MD Phone: [phone]  Subjective:   URI  Has been sick for 2 days. Nasal discharge: yes Medications tried: pseudophed for one day Sick contacts: son had recent viral URI.   Symptoms Fever: none.  Headache or face pain: none.  Tooth pain: none.  Sneezing: none.  Scratchy throat: yes Allergies: none.  Muscle aches: yes.  Severe fatigue Stiff neck: No.  Shortness of breath: No.  Rash: none.  Sore throat or swollen glands: No.   Nasal discharge, scratchy throat, and malaise.   ROS see HPI Smoking Status noted    All relevant systems were reviewed and were negative unless otherwise noted in the HPI  Past Medical History Reviewed problem list.  Medications- reviewed and updated Current Outpatient Prescriptions  Medication Sig Dispense Refill  . Ca Carbonate-Mag Hydroxide (ROLAIDS PO) Take 1 tablet by mouth daily as needed (for heartburn).    . fluticasone (FLONASE) 50 MCG/ACT nasal spray Place 2 sprays into both nostrils daily. 16 g 6  . ibuprofen (ADVIL,MOTRIN) 600 MG tablet Take 1 tablet (600 mg total) by mouth every 6 (six) hours. 30 tablet 0   No current facility-administered medications for this visit.   Chief complaint-noted No additions to family history Social history- patient is a non smoker  Objective: BP 122/82 mmHg  Pulse 99  Temp(Src) 99.5 F (37.5 C) (Oral)  Wt 118 lb (53.524 kg) Gen: NAD, alert, cooperative with exam HEENT: NCAT, EOMI, PERRL, TMs nml, no LAD, O/P clear with slight erythema posteriorly. Tonsils without exudates or swelling.  Neck: FROM, supple CV: RRR, good S1/S2, no murmur, cap refill <3 Resp: CTABL, no wheezes, non-labored Abd: SNTND, BS present, no guarding or organomegaly Ext: No edema, warm, normal tone, moves UE/LE spontaneously Neuro: Alert and oriented, No gross deficits Skin:  no rashes no lesions  Assessment/Plan:  # URI  - Viral, supportive measures - continue pseudophedrine / loratidine combo - ibuprofen  q6 hrs for pain and irritation.  - drink plenty of water - return precautions reviewed. Patient agreed.  - follow up prn.

## 2015-12-30 NOTE — Patient Instructions (Signed)
Thanks for coming in today.   You have a viral upper respiratory infection.   Take the medicine that you currently have as directed on the package.   Additionally, take ibuprofen  every 6 hours for 2-3 days to help with the pain and inflammation.   You can additionally take benadryl if you need it, just don't take the loratidine at the same time.   Drink plenty of water.   If your symptoms worsen, you develop persistent fevers over 100.4, or if your symptoms last longer than 10 days, then let us know as we may need to give you an antibiotic for your symptoms.   Thanks for letting us take care of you.   Sincerely, Devota Pace, MD Family Medicine - PGY 2  Viral Infections A viral infection can be caused by different types of viruses.Most viral infections are not serious and resolve on their own. However, some infections may cause severe symptoms and may lead to further complications. SYMPTOMS Viruses can frequently cause:  Minor sore throat.  Aches and pains.  Headaches.  Runny nose.  Different types of rashes.  Watery eyes.  Tiredness.  Cough.  Loss of appetite.  Gastrointestinal infections, resulting in nausea, vomiting, and diarrhea. These symptoms do not respond to antibiotics because the infection is not caused by bacteria. However, you might catch a bacterial infection following the viral infection. This is sometimes called a "superinfection." Symptoms of such a bacterial infection may include:  Worsening sore throat with pus and difficulty swallowing.  Swollen neck glands.  Chills and a high or persistent fever.  Severe headache.  Tenderness over the sinuses.  Persistent overall ill feeling (malaise), muscle aches, and tiredness (fatigue).  Persistent cough.  Yellow, green, or brown mucus production with coughing. HOME CARE INSTRUCTIONS   Only take over-the-counter or prescription medicines for pain, discomfort, diarrhea, or fever as  directed by your caregiver.  Drink enough water and fluids to keep your urine clear or pale yellow. Sports drinks can provide valuable electrolytes, sugars, and hydration.  Get plenty of rest and maintain proper nutrition. Soups and broths with crackers or rice are fine. SEEK IMMEDIATE MEDICAL CARE IF:   You have severe headaches, shortness of breath, chest pain, neck pain, or an unusual rash.  You have uncontrolled vomiting, diarrhea, or you are unable to keep down fluids.  You or your child has an oral temperature above 102 F (38.9 C), not controlled by medicine.  Your baby is older than 3 months with a rectal temperature of 102 F (38.9 C) or higher.  Your baby is 24 months old or younger with a rectal temperature of 100.4 F (38 C) or higher. MAKE SURE YOU:   Understand these instructions.  Will watch your condition.  Will get help right away if you are not doing well or get worse.   This information is not intended to replace advice given to you by your health care provider. Make sure you discuss any questions you have with your health care provider.   Document Released: 08/04/2005 Document Revised: 01/17/2012 Document Reviewed: 04/02/2015 Elsevier Interactive Patient Education [employer]

## 2016-02-19 ENCOUNTER — Ambulatory Visit (INDEPENDENT_AMBULATORY_CARE_PROVIDER_SITE_OTHER): Payer: Medicaid Other | Admitting: Family Medicine

## 2016-02-19 ENCOUNTER — Encounter: Payer: Self-pay | Admitting: Family Medicine

## 2016-02-19 VITALS — BP 117/63 | HR 86 | Temp 98.9°F | Ht 66.0 in | Wt 119.1 lb

## 2016-02-19 DIAGNOSIS — Z3009 Encounter for other general counseling and advice on contraception: Secondary | ICD-10-CM | POA: Diagnosis not present

## 2016-02-19 LAB — POCT URINE PREGNANCY: Preg Test, Ur: NEGATIVE

## 2016-02-19 MED ORDER — NORELGESTROMIN-ETH ESTRADIOL 150-35 MCG/24HR TD PTWK: 1.0000 | MEDICATED_PATCH | TRANSDERMAL | Status: DC

## 2016-02-19 NOTE — Progress Notes (Signed)
   Subjective:    Anne Vaughn - 20 y.o. female MRN [id number]  Date of birth: 07-04-1996  CC Contraceptive management  HPI  Anne Vaughn is here for Contraceptive management.  Contraceptive management Has been on sprintec and Lo/Ovral before Her last cycle was last month around the 20.  Lasts about a week  This past cycle she reports spotting for longer than usual  No pain, vaginal discharge.  Denies any tobacco use or prior history of deep vein thrombosis.  SH: No tobacco or alcohol use. PMH: Iron deficiency anemia  Health Maintenance:  Health Maintenance Due  Topic Date Due  . PAP SMEAR  10/29/2015    Review of Systems See HPI     Objective:   Physical Exam BP 117/63 mmHg  Pulse 86  Temp(Src) 98.9 F (37.2 C) (Oral)  Ht 5\' 6"  (1.676 m)  Wt 119 lb 1.6 oz (54.023 kg)  BMI 19.23 kg/m2  LMP 01/28/2016 (Approximate)  Breastfeeding? No Gen: NAD, alert, cooperative with exam,  CV: RRR, good S1/S2, no murmur, Resp: CTABL, no wheezes, non-labored  Assessment & Plan:   Contraceptive management Patient would like to go with the transdermal contraceptive patch. Denies any prior DVT and is not a tobacco user. - Counseled on the use of the patch - Ortho EVRA sent in

## 2016-02-19 NOTE — Patient Instructions (Addendum)
Thank you for coming in,   A different site is used each time a new patch is applied.  The patch is changed once a week for three weeks (21 total days), followed by one week that is patch-free. It should always be changed/applied on the same day of the week (eg, Sundays for Sunday start)  If a patch is detached for less than 24 hours, it can be reapplied at the same location or replaced with a new patch immediately. If detachment lasts longer than 24 hours, a new patch should be applied.  The most common side effects associated with patch use are unscheduled bleeding in the first few cycles, breast tenderness, and application site reactions.  Sign up for My Chart to have easy access to your labs results, and communication with your Primary care physician   Please feel free to call with any questions or concerns at any time, at [phone]. --Dr. Jordan LikesSchmitz

## 2016-02-19 NOTE — Assessment & Plan Note (Addendum)
Patient would like to go with the transdermal contraceptive patch. Denies any prior DVT and is not a tobacco user. - Counseled on the use of the patch - Ortho EVRA sent in

## 2016-05-03 ENCOUNTER — Ambulatory Visit: Payer: Medicaid Other | Admitting: Family Medicine

## 2016-08-24 ENCOUNTER — Encounter (HOSPITAL_COMMUNITY): Payer: Self-pay

## 2016-08-24 ENCOUNTER — Emergency Department (HOSPITAL_COMMUNITY)
Admission: EM | Admit: 2016-08-24 | Discharge: 2016-08-25 | Disposition: A | Discharge status: Home / Self Care | Payer: Medicaid Other | Attending: Emergency Medicine | Admitting: Emergency Medicine

## 2016-08-24 DIAGNOSIS — R319 Hematuria, unspecified: Secondary | ICD-10-CM | POA: Insufficient documentation

## 2016-08-24 DIAGNOSIS — Z3A01 Less than 8 weeks gestation of pregnancy: Secondary | ICD-10-CM | POA: Diagnosis not present

## 2016-08-24 DIAGNOSIS — R109 Unspecified abdominal pain: Secondary | ICD-10-CM

## 2016-08-24 DIAGNOSIS — R1012 Left upper quadrant pain: Secondary | ICD-10-CM | POA: Insufficient documentation

## 2016-08-24 DIAGNOSIS — O26891 Other specified pregnancy related conditions, first trimester: Secondary | ICD-10-CM | POA: Insufficient documentation

## 2016-08-24 LAB — COMPREHENSIVE METABOLIC PANEL
ALT: 14 U/L (ref 14–54)
ANION GAP: 6 (ref 5–15)
AST: 35 U/L (ref 15–41)
Albumin: 3.9 g/dL (ref 3.5–5.0)
Alkaline Phosphatase: 63 U/L (ref 38–126)
BUN: 10 mg/dL (ref 6–20)
CALCIUM: 8.9 mg/dL (ref 8.9–10.3)
CHLORIDE: 106 mmol/L (ref 101–111)
CO2: 23 mmol/L (ref 22–32)
Creatinine, Ser: 0.6 mg/dL (ref 0.44–1.00)
GFR calc non Af Amer: >60 mL/min (ref >60)
Glucose, Bld: 119 mg/dL — ABNORMAL HIGH (ref 65–99)
Potassium: 3.3 mmol/L — ABNORMAL LOW (ref 3.5–5.1)
SODIUM: 135 mmol/L (ref 135–145)
Total Bilirubin: 0.7 mg/dL (ref 0.3–1.2)
Total Protein: 7.6 g/dL (ref 6.5–8.1)

## 2016-08-24 LAB — URINALYSIS, ROUTINE W REFLEX MICROSCOPIC
Bilirubin Urine: NEGATIVE
Glucose, UA: NEGATIVE mg/dL
HGB URINE DIPSTICK: NEGATIVE
Ketones, ur: >80 mg/dL — AB
Leukocytes, UA: NEGATIVE
Nitrite: NEGATIVE
Protein, ur: NEGATIVE mg/dL
SPECIFIC GRAVITY, URINE: 1.03 (ref 1.005–1.030)
pH: 6 (ref 5.0–8.0)

## 2016-08-24 LAB — I-STAT BETA HCG BLOOD, ED (MC, WL, AP ONLY)

## 2016-08-24 LAB — CBC
HCT: 34.9 % — ABNORMAL LOW (ref 36.0–46.0)
HEMOGLOBIN: 11.6 g/dL — AB (ref 12.0–15.0)
MCH: 25.2 pg — AB (ref 26.0–34.0)
MCHC: 33.2 g/dL (ref 30.0–36.0)
MCV: 75.9 fL — AB (ref 78.0–100.0)
Platelets: 280 10*3/uL (ref 150–400)
RBC: 4.6 MIL/uL (ref 3.87–5.11)
RDW: 15.6 % — ABNORMAL HIGH (ref 11.5–15.5)
WBC: 4.1 10*3/uL (ref 4.0–10.5)

## 2016-08-24 LAB — LIPASE, BLOOD: LIPASE: 24 U/L (ref 11–51)

## 2016-08-24 NOTE — ED Triage Notes (Signed)
Patient c/o abdominal pain x2 weeks.  Patient states that the pain is in the LUQ and LLQ.  Patient c/o diarrhea and sharp pains to that area, denies blood in stool.  Denies chest pain and SOB.

## 2016-08-24 NOTE — ED Notes (Signed)
Krammes called x1 for triage, no answer

## 2016-08-24 NOTE — ED Triage Notes (Signed)
Patient also states that has had "light spotting" between her menstrual cycle.  Last cycle was 08/04/16

## 2016-08-24 NOTE — ED Provider Notes (Signed)
[facility]-EMERGENCY DEPT Provider Note   CSN: [id number] Arrival date & time: 08/24/16  2022     History   Chief Complaint Chief Complaint  Patient presents with  . Abdominal Pain    HPI Anne Vaughn is a 20 y.o. female.  The history is provided by the patient.  Abdominal Pain   This is a new problem. Episode onset: 2 weeks. The problem occurs constantly (fluctuating). The problem has been gradually worsening. The pain is located in the generalized abdominal region (now worse in LUQ). The quality of the pain is cramping and sharp. The pain is moderate. Associated symptoms include nausea and hematuria (intermittent, x2. ). Pertinent negatives include anorexia, fever, diarrhea, hematochezia, melena, vomiting, constipation, dysuria and arthralgias. Nothing aggravates the symptoms. Nothing relieves the symptoms. Past workup does not include surgery. Her past medical history does not include PUD, GERD, ulcerative colitis or Crohn's disease.    Past Medical History:  Diagnosis Date  . Anemia     Patient Active Problem List   Diagnosis Date Noted  . Contraceptive management 02/19/2016  . Routine postpartum follow-up 09/22/2015  . Iron deficiency anemia 01/27/2011    Past Surgical History:  Procedure Laterality Date  . NO PAST SURGERIES      OB History    Gravida Para Term Preterm AB Living   1 1 1     1    SAB TAB Ectopic Multiple Live Births         0 1       Home Medications    Prior to Admission medications   Not on File    Family History Family History  Problem Relation Age of Onset  . Hypertension Mother     Social History Social History  Substance Use Topics  . Smoking status: Never Smoker  . Smokeless tobacco: Never Used  . Alcohol use No     Allergies   Review of patient's allergies indicates no known allergies.   Review of Systems Review of Systems  Constitutional: Negative for chills and fever.  HENT: Negative for ear pain and sore  throat.   Eyes: Negative for pain and visual disturbance.  Respiratory: Negative for cough and shortness of breath.   Cardiovascular: Negative for chest pain and palpitations.  Gastrointestinal: Positive for abdominal pain and nausea. Negative for anorexia, constipation, diarrhea, hematochezia, melena and vomiting.  Genitourinary: Positive for hematuria (intermittent, x2. ). Negative for dysuria, flank pain, pelvic pain, vaginal bleeding and vaginal discharge.  Musculoskeletal: Negative for arthralgias and back pain.  Skin: Negative for color change and rash.  Neurological: Negative for seizures and syncope.  All other systems reviewed and are negative.    Physical Exam Updated Vital Signs BP 118/74 (BP Location: Right Arm)   Pulse 71   Temp 99.2 F (37.3 C) (Oral)   Resp 16   Ht 5\' 6"  (1.676 m)   Wt 114 lb 11.2 oz (52 kg)   LMP 08/04/2016   SpO2 99%   BMI 18.51 kg/m   Physical Exam  Constitutional: She is oriented to person, place, and time. She appears well-developed and well-nourished. No distress.  HENT:  Head: Normocephalic and atraumatic.  Nose: Nose normal.  Eyes: Conjunctivae and EOM are normal. Pupils are equal, round, and reactive to light. Right eye exhibits no discharge. Left eye exhibits no discharge. No scleral icterus.  Neck: Normal range of motion. Neck supple.  Cardiovascular: Normal rate and regular rhythm.  Exam reveals no gallop and no friction rub.  No murmur heard. Pulmonary/Chest: Effort normal and breath sounds normal. No stridor. No respiratory distress. She has no rales.  Abdominal: Soft. She exhibits no distension. There is no hepatosplenomegaly or splenomegaly. There is tenderness in the left upper quadrant. There is CVA tenderness (left). There is no rigidity, no rebound and no guarding.  Musculoskeletal: She exhibits no edema or tenderness.  Neurological: She is alert and oriented to person, place, and time.  Skin: Skin is warm and dry. No rash  noted. She is not diaphoretic. No erythema.  Psychiatric: She has a normal mood and affect.  Vitals reviewed.    ED Treatments / Results  Labs (all labs ordered are listed, but only abnormal results are displayed) Labs Reviewed  COMPREHENSIVE METABOLIC PANEL - Abnormal; Notable for the following:       Result Value   Potassium 3.3 (*)    Glucose, Bld 119 (*)    All other components within normal limits  CBC - Abnormal; Notable for the following:    Hemoglobin 11.6 (*)    HCT 34.9 (*)    MCV 75.9 (*)    MCH 25.2 (*)    RDW 15.6 (*)    All other components within normal limits  URINALYSIS, ROUTINE W REFLEX MICROSCOPIC (NOT AT [facility]) - Abnormal; Notable for the following:    APPearance CLOUDY (*)    Ketones, ur >80 (*)    All other components within normal limits  I-STAT BETA HCG BLOOD, ED ([facility], [facility], [facility] ONLY) - Abnormal; Notable for the following:    I-stat hCG, quantitative >2,000.0 (*)    All other components within normal limits  LIPASE, BLOOD    EKG  EKG Interpretation None       Radiology No results found.  Procedures Procedures (including critical care time)  EMERGENCY DEPARTMENT [place] PREGNANCY "Study: Limited Ultrasound of the Pelvis for Pregnancy"  INDICATIONS:Pregnancy(required) Multiple views of the uterus and pelvic cavity were obtained in real-time with a multi-frequency probe.  APPROACH:Transvaginal  PERFORMED BY: Myself  IMAGES ARCHIVED?: Yes  LIMITATIONS: Body habitus  PREGNANCY FREE FLUID: None  ADNEXAL FINDINGS:Left ovary not seen  PREGNANCY FINDINGS: Yolk sac noted, Fetal pole present and Fetal heart activity seen  INTERPRETATION: Viable intrauterine pregnancy  GESTATIONAL AGE, ESTIMATE: [redacted]w[redacted]d  FETAL HEART RATE: 120  CPT Codes:   76Sep 23, 2052 (transvaginal OB, Reduced level of service for incomplete exam)  Emergency Focused Ultrasound Exam Limited Retroperitoneal Ultrasound of Kidneys and Bladder  Performed and interpreted by Dr.  Eudelia Bunch Focused abdominal ultrasound with both kidneys and bladder imaged in transverse and longitudinal planes in real-time. Indication: flank pain Findings: bilateral kidneys present, no shadowing, mild anechoic areas Interpretation: mild bilateral hydronephrosis visualized.  no stones or cysts visualized  Images archived electronically  CPT Code: 16109   Medications Ordered in ED Medications - No data to display   Initial Impression / Assessment and Plan / ED Course  I have reviewed the triage vital signs and the nursing notes.  Pertinent labs & imaging results that were available during my care of the patient were reviewed by me and considered in my medical decision making (see chart for details).  Clinical Course    1. Left flank/LUQ pain Workup without evidence of pancreatitis. UA without evidence of infection or hematuria. Bedside ultrasound with very mild hydronephrosis bilaterally. No visualized stones. Labs reassuring. No evidence of peritonitis.   2. Pregnancy Last menstrual period was early September. Bedside ultrasound confirming IUP at approximately 6 weeks and 6 days.  Good fetal heart tones. No pelvic pain. No evidence suggestive of ectopic pregnancy. Prenatal care recommended. Patient instructed to follow-up with OB/GYN for continued prenatal care.   Final Clinical Impressions(s) / ED Diagnoses   Final diagnoses:  Abdominal cramping  Less than [redacted] weeks gestation of pregnancy   Disposition: Discharge  Condition: Good  I have discussed the results, Dx and Tx plan with the patient who expressed understanding and agree(s) with the plan. Discharge instructions discussed at great length. The patient was given strict return precautions who verbalized understanding of the instructions. No further questions at time of discharge.    Current Discharge Medication List      Follow Up: Ob/Gyn  Schedule an appointment as soon as possible for a visit  for prenatal  care      Nira ConnPedro Eduardo Cardama, MD 08/25/16 [phone]

## 2016-08-26 ENCOUNTER — Ambulatory Visit: Payer: Medicaid Other | Admitting: Family Medicine

## 2016-09-13 ENCOUNTER — Encounter: Payer: Self-pay | Admitting: Family Medicine

## 2016-09-13 ENCOUNTER — Ambulatory Visit (INDEPENDENT_AMBULATORY_CARE_PROVIDER_SITE_OTHER): Payer: Medicaid Other | Admitting: Family Medicine

## 2016-09-13 VITALS — BP 90/67 | HR 88 | Temp 98.2°F | Wt 116.0 lb

## 2016-09-13 DIAGNOSIS — O0001 Abdominal pregnancy with intrauterine pregnancy: Secondary | ICD-10-CM

## 2016-09-13 DIAGNOSIS — Z124 Encounter for screening for malignant neoplasm of cervix: Secondary | ICD-10-CM | POA: Diagnosis not present

## 2016-09-13 DIAGNOSIS — Z3481 Encounter for supervision of other normal pregnancy, first trimester: Secondary | ICD-10-CM | POA: Diagnosis present

## 2016-09-13 LAB — POCT URINALYSIS DIPSTICK
BILIRUBIN UA: NEGATIVE
Glucose, UA: NEGATIVE
NITRITE UA: POSITIVE
PH UA: 6.5
RBC UA: NEGATIVE
SPEC GRAV UA: 1.025
UROBILINOGEN UA: 1

## 2016-09-13 LAB — POCT UA - MICROSCOPIC ONLY

## 2016-09-13 NOTE — Patient Instructions (Signed)
It was a pleasure to meet you today! I will see you back on Monday 11/13 at 9am for a new OB appointment. In the meantime, please get a prenatal vitamin with folic acid to take.

## 2016-09-13 NOTE — Progress Notes (Signed)
   CC: new pregnancy  HPI Newly discovered pregnancy in ED 3 weeks ago. Unintended pregnancy, same FOB as 13 mo son. They are both present today and happy. Transvaginal US at that time with 4855w6d dating and viable IUP. Nauseous, no vomiting yet. Didn't vomit with other pregnancy (13 mo son). Had stopped the patch 3 months ago, maybe longer. Never smoker. Didn't drink any alcohol prior to finding out about this pregnancy. Breastfed her 13 mo for 4 months. Dad has schizophrenia and bipolar. No on PNV right now. Not working. No bleeding or discharge.   CC, SH/smoking status, and VS noted  Objective: BP 90/67   Pulse 88   Temp 98.2 F (36.8 C) (Oral)   Wt 116 lb (52.6 kg)   LMP 07/13/2016   SpO2 95%   BMI 18.72 kg/m  Gen: NAD, alert, cooperative, and pleasant. HEENT: NCAT, EOMI, PERRL CV: RRR, no murmur Resp: CTAB, no wheezes, non-labored Abd: SNTND, BS present, no guarding or organomegaly. Uterus not palpated.  Ext: No edema, warm Neuro: Alert and oriented, Speech clear, No gross deficits  Assessment and plan: Intrauterine Pregnancy, EDC 04/13/2017: -deferred doppler HR at this time -ordered OB labs -scheduled new OB appt with me 11/13 -counsel[facility] PNV and [facility]  Loni MuseKate Timberlake, MD, PGY1 09/13/2016 10:41 AM

## 2016-09-14 DIAGNOSIS — Z3490 Encounter for supervision of normal pregnancy, unspecified, unspecified trimester: Secondary | ICD-10-CM | POA: Insufficient documentation

## 2016-09-14 LAB — OBSTETRIC PANEL
ANTIBODY SCREEN: NEGATIVE
BASOS PCT: 1 %
Basophils Absolute: 44 cells/uL (ref 0–200)
EOS PCT: 5 %
Eosinophils Absolute: 220 cells/uL (ref 15–500)
HCT: 34.8 % — ABNORMAL LOW (ref 35.0–45.0)
HEMOGLOBIN: 11.3 g/dL — AB (ref 11.7–15.5)
Hepatitis B Surface Ag: NEGATIVE
LYMPHS PCT: 38 %
Lymphs Abs: 1672 cells/uL (ref 850–3900)
MCH: 25.1 pg — AB (ref 27.0–33.0)
MCHC: 32.5 g/dL (ref 32.0–36.0)
MCV: 77.2 fL — AB (ref 80.0–100.0)
MONOS PCT: 10 %
MPV: 9.2 fL (ref 7.5–12.5)
Monocytes Absolute: 440 cells/uL (ref 200–950)
Neutro Abs: 2024 cells/uL (ref 1500–7800)
Neutrophils Relative %: 46 %
Platelets: 359 10*3/uL (ref 140–400)
RBC: 4.51 MIL/uL (ref 3.80–5.10)
RDW: 16.8 % — ABNORMAL HIGH (ref 11.0–15.0)
RH TYPE: POSITIVE
Rubella: 4.24 Index — ABNORMAL HIGH (ref <0.90)
WBC: 4.4 10*3/uL (ref 3.8–10.8)

## 2016-09-14 LAB — HIV ANTIBODY (ROUTINE TESTING W REFLEX): HIV: NONREACTIVE

## 2016-09-15 ENCOUNTER — Telehealth: Payer: Self-pay | Admitting: Family Medicine

## 2016-09-15 DIAGNOSIS — O9989 Other specified diseases and conditions complicating pregnancy, childbirth and the puerperium: Principal | ICD-10-CM

## 2016-09-15 DIAGNOSIS — O99891 Other specified diseases and conditions complicating pregnancy: Secondary | ICD-10-CM | POA: Insufficient documentation

## 2016-09-15 DIAGNOSIS — R8271 Bacteriuria: Secondary | ICD-10-CM

## 2016-09-15 LAB — CULTURE, OB URINE: Colony Count: >=100000

## 2016-09-15 MED ORDER — CEPHALEXIN 500 MG PO CAPS: 500.0000 mg | ORAL_CAPSULE | Freq: Four times a day (QID) | ORAL | 0 refills | Status: DC

## 2016-09-15 NOTE — Telephone Encounter (Signed)
OB urine culture with >100,000 CFU E coli sensitive to Keflex. Will Rx Keflex 500mg  Q6H x 7 days. Will follow up for test of cure in 3 weeks. Left VM for patient to call back clinic to acknowledge this plan and let her know about the results.

## 2016-09-16 ENCOUNTER — Telehealth: Payer: Self-pay | Admitting: Family Medicine

## 2016-09-16 NOTE — Telephone Encounter (Signed)
I called both of patient's contact numbers today, but she didn't answer either and I couldn't leave VM. I called mother of patient to ask for additional phone number, she said she would get in touch with the patient and have her call the clinic main number. If she calls, please let her know that she has a UTI and she needs to go to her pharmacy to get the keflex I sent in yesterday.

## 2016-09-17 ENCOUNTER — Telehealth: Payer: Self-pay | Admitting: Family Medicine

## 2016-09-17 NOTE — Telephone Encounter (Signed)
Was able to reach Anne Vaughn today. She confirmed that she had called the clinic yesterday and was told about the UTI and need to get keflex. She was able to teach back that she should get the antibiotic today and take the full course. Counseled her that we will need a test of cure in 3 weeks, but we can talk about this again on Monday at her new OB appt.

## 2016-09-20 ENCOUNTER — Telehealth: Payer: Self-pay | Admitting: Family Medicine

## 2016-09-20 ENCOUNTER — Encounter: Payer: Medicaid Other | Admitting: Family Medicine

## 2016-09-20 ENCOUNTER — Telehealth: Payer: Self-pay

## 2016-09-20 MED ORDER — CEPHALEXIN 500 MG PO CAPS: 500.0000 mg | ORAL_CAPSULE | Freq: Four times a day (QID) | ORAL | 0 refills | Status: AC

## 2016-09-20 NOTE — Telephone Encounter (Signed)
Called patient as she no showed her new OB appointment today. She was confused about the time, has rescheduled for tomorrow afternoon at 2 PM. Also discussed with her whether she picked up the Keflex for her asymptomatic bacteriuria, she says she has difficulties with her insurance and has not picked this up. [facility] on the $4 list regardless of insurance. I instructed her to ask for the cash price if they give her additional trouble with her insurance.

## 2016-09-20 NOTE — Progress Notes (Deleted)
INITIAL VISIT:  Anne Vaughn is a 20 y.o. yo G2P1001 at Unknown who presents for her initial prenatal visit. Pregnancy {is/is not:9024} planned She reports {pregnancy symptoms:18128}. She  {is/is not:9024} taking PNV. See flow sheet for details.  PMH, POBH, FH, meds, allergies and Social Hx reviewed.  Prenatal Exam: Gen: Well nourished, well developed.  No distress.  Vitals noted. HEENT: Normocephalic, atraumatic.  Neck supple without cervical lymphadenopathy, thyromegaly or thyroid nodules.  Fair dentition. CV: RRR no murmur, gallops or rubs Lungs: CTAB.  Normal respiratory effort without wheezes or rales. Abd: soft, NTND. +BS.  Uterus not appreciated above pelvis. GU: Normal external female genitalia without lesions.  Normal vaginal, well rugated without lesions. No vaginal discharge.  Bimanual exam: No adnexal mass or TTP. No CMT.  Uterus size *** Ext: No clubbing, cyanosis or edema. Psych: Normal grooming and dress.  Not depressed or anxious appearing.  Normal thought content and process without flight of ideas or looseness of associations.  Assessment & Plan: 1) 20 y.o. yo G2P1001 at Unknown via {Ob dating:14516} doing well.  Current pregnancy issues include ***. Dating {is/is not:9024} reliable. Prenatal labs reviewed, notable for ***. Genetic screening offered: ***. Early glucola {is/is X1782380not:19887} indicated.  PHQ-9 and Pregnancy Medical Home forms completed and reviewed.  Bleeding and pain precautions reviewed. Importance of prenatal vitamins reviewed.  Follow up in 4 weeks. Needs TOC for asymptomatic bacteruria in 2 weeks.

## 2016-09-21 ENCOUNTER — Encounter: Payer: Medicaid Other | Admitting: Family Medicine

## 2016-09-21 NOTE — Progress Notes (Deleted)
INITIAL VISIT:  Anne Vaughn is a 20 y.o. yo G2P1001 at Unknown who presents for her initial prenatal visit. Pregnancy {is/is not:9024} planned She reports {pregnancy symptoms:18128}. She  {is/is not:9024} taking PNV. See flow sheet for details.  PMH, POBH, FH, meds, allergies and Social Hx reviewed.  Prenatal Exam: Gen: Well nourished, well developed.  No distress.  Vitals noted. HEENT: Normocephalic, atraumatic.  Neck supple without cervical lymphadenopathy, thyromegaly or thyroid nodules.  Fair dentition. CV: RRR no murmur, gallops or rubs Lungs: CTAB.  Normal respiratory effort without wheezes or rales. Abd: soft, NTND. +BS.  Uterus not appreciated above pelvis. GU: Normal external female genitalia without lesions.  Normal vaginal, well rugated without lesions. No vaginal discharge.  Bimanual exam: No adnexal mass or TTP. No CMT.  Uterus size *** Ext: No clubbing, cyanosis or edema. Psych: Normal grooming and dress.  Not depressed or anxious appearing.  Normal thought content and process without flight of ideas or looseness of associations.  Assessment & Plan: 1) 20 y.o. yo G2P1001 at Unknown via {Ob dating:14516} doing well.  Current pregnancy issues include ***. Dating {is/is not:9024} reliable. Prenatal labs reviewed, notable for ***. Genetic screening offered: ***. Early glucola {is/is X1782380not:19887} indicated.  PHQ-9 and Pregnancy Medical Home forms completed and reviewed.  Bleeding and pain precautions reviewed. Importance of prenatal vitamins reviewed.  Follow up in 4 weeks.

## 2016-09-21 NOTE — Telephone Encounter (Signed)
Pt meet with Dr. Chanetta Marshallimberlake today! Sunday SpillersSharon T Kashonda Vaughn, CMA

## 2016-11-08 NOTE — L&D Delivery Note (Addendum)
  Delivery Note At 10:29 PM a healthy female was delivered via Vaginal, Spontaneous Delivery (Presentation: Vertex, ROA).  APGAR: 8, 9; weight  Pending  Placenta status: Spontaneous removal, Intact  Cord: three vessel with the following complications: None.  Cord pH:  N/A  Anesthesia: Epidural   Episiotomy: None Lacerations: None Est. Blood Loss (mL): 50  Mom to postpartum.  Baby to Couplet care / Skin to Skin.   Ardyth HarpsJohn Adeolu Keku Medical Student 04/02/2017, 11:02 PM  Medical Student DELIVERY ATTESTATION  I was gloved and present for the delivery in its entirety, and I agree with the above medical student's note.    Raelyn Moraolitta Debborah Alonge, MSN, CNM 11:11 PM

## 2016-11-15 ENCOUNTER — Ambulatory Visit (INDEPENDENT_AMBULATORY_CARE_PROVIDER_SITE_OTHER): Payer: Medicaid Other | Admitting: Family Medicine

## 2016-11-15 ENCOUNTER — Encounter: Payer: Self-pay | Admitting: Family Medicine

## 2016-11-15 VITALS — BP 100/80 | HR 100 | Temp 98.4°F | Wt 114.0 lb

## 2016-11-15 DIAGNOSIS — Z124 Encounter for screening for malignant neoplasm of cervix: Secondary | ICD-10-CM

## 2016-11-15 DIAGNOSIS — O99891 Other specified diseases and conditions complicating pregnancy: Secondary | ICD-10-CM

## 2016-11-15 DIAGNOSIS — Z3482 Encounter for supervision of other normal pregnancy, second trimester: Secondary | ICD-10-CM

## 2016-11-15 DIAGNOSIS — R8271 Bacteriuria: Secondary | ICD-10-CM

## 2016-11-15 DIAGNOSIS — O9989 Other specified diseases and conditions complicating pregnancy, childbirth and the puerperium: Secondary | ICD-10-CM

## 2016-11-15 NOTE — Patient Instructions (Signed)
Second Trimester of Pregnancy The second trimester is from week 13 through week 28, month 4 through 6. This is often the time in pregnancy that you feel your best. Often times, morning sickness has lessened or quit. You may have more energy, and you may get hungry more often. Your unborn baby (fetus) is growing rapidly. At the end of the sixth month, he or she is about 9 inches long and weighs about 1 pounds. You will likely feel the baby move (quickening) between 18 and 20 weeks of pregnancy. Follow these instructions at home:  Avoid all smoking, herbs, and alcohol. Avoid drugs not approved by your doctor.  Do not use any tobacco products, including cigarettes, chewing tobacco, and electronic cigarettes. If you need help quitting, ask your doctor. You may get counseling or other support to help you quit.  Only take medicine as told by your doctor. Some medicines are safe and some are not during pregnancy.  Exercise only as told by your doctor. Stop exercising if you start having cramps.  Eat regular, healthy meals.  Wear a good support bra if your breasts are tender.  Do not use hot tubs, steam rooms, or saunas.  Wear your seat belt when driving.  Avoid raw meat, uncooked cheese, and liter boxes and soil used by cats.  Take your prenatal vitamins.  Take 1500-2000 milligrams of calcium daily starting at the 20th week of pregnancy until you deliver your baby.  Try taking medicine that helps you poop (stool softener) as needed, and if your doctor approves. Eat more fiber by eating fresh fruit, vegetables, and whole grains. Drink enough fluids to keep your pee (urine) clear or pale yellow.  Take warm water baths (sitz baths) to soothe pain or discomfort caused by hemorrhoids. Use hemorrhoid cream if your doctor approves.  If you have puffy, bulging veins (varicose veins), wear support hose. Raise (elevate) your feet for 15 minutes, 3-4 times a day. Limit salt in your diet.  Avoid heavy  lifting, wear low heals, and sit up straight.  Rest with your legs raised if you have leg cramps or low back pain.  Visit your dentist if you have not gone during your pregnancy. Use a soft toothbrush to brush your teeth. Be gentle when you floss.  You can have sex (intercourse) unless your doctor tells you not to.  Go to your doctor visits. Get help if:  You feel dizzy.  You have mild cramps or pressure in your lower belly (abdomen).  You have a nagging pain in your belly area.  You continue to feel sick to your stomach (nauseous), throw up (vomit), or have watery poop (diarrhea).  You have bad smelling fluid coming from your vagina.  You have pain with peeing (urination). Get help right away if:  You have a fever.  You are leaking fluid from your vagina.  You have spotting or bleeding from your vagina.  You have severe belly cramping or pain.  You lose or gain weight rapidly.  You have trouble catching your breath and have chest pain.  You notice sudden or extreme puffiness (swelling) of your face, hands, ankles, feet, or legs.  You have not felt the baby move in over an hour.  You have severe headaches that do not go away with medicine.  You have vision changes. This information is not intended to replace advice given to you by your health care provider. Make sure you discuss any questions you have with your health care   provider. Document Released: 01/19/2010 Document Revised: 04/01/2016 Document Reviewed: 12/26/2012 Elsevier Interactive Patient Education  2017 Elsevier Inc.  

## 2016-11-15 NOTE — Progress Notes (Signed)
    OB INITIAL VISIT:  Anne Vaughn is a 21 y.o. yo G2P1001 at 8548w5d who presents for her initial prenatal visit. Pregnancy is not planned She reports nausea and bloating. She  is taking PNV. See flow sheet for details.  PMH, POBH, FH, meds, allergies and Social Hx reviewed.  Prenatal Exam: Gen: Well nourished, well developed.  No distress.  Vitals noted. HEENT: Normocephalic, atraumatic.  Neck supple without cervical lymphadenopathy, thyromegaly or thyroid nodules.  Fair dentition. CV: RRR no murmur, gallops or rubs Lungs: CTAB.  Normal respiratory effort without wheezes or rales. Abd: soft, NTND. +BS.  Uterus not appreciated above pelvis. GU: Normal external female genitalia without lesions.  Normal vaginal, well rugated without lesions. No vaginal discharge.  Bimanual exam: No adnexal mass or TTP. No CMT.  Uterus size 18cm. Ext: No clubbing, cyanosis or edema. Psych: Normal grooming and dress.  Not depressed or anxious appearing.  Normal thought content and process without flight of ideas or looseness of associations.  Assessment & Plan: 1) 21 y.o. yo G2P1001 at 6448w5d via early ultrasound doing well.  Current pregnancy issues include none. Dating is reliable. Prenatal labs reviewed, notable for E coli bacteruria and anemia. Genetic screening offered: patient would like QUAD screen. Early glucola is not indicated.  PHQ-9 and Pregnancy Medical Home forms completed and reviewed.  Bleeding and pain precautions reviewed. Importance of prenatal vitamins reviewed.  Follow up in 4 weeks.  Loni MuseKate Lucynda Rosano, MD, PGY1 11/15/2016 3:17 PM

## 2016-11-16 ENCOUNTER — Other Ambulatory Visit (HOSPITAL_COMMUNITY)
Admission: RE | Admit: 2016-11-16 | Discharge: 2016-11-16 | Disposition: A | Discharge status: Home / Self Care | Payer: Medicaid Other | Source: Ambulatory Visit | Attending: Family Medicine | Admitting: Family Medicine

## 2016-11-16 DIAGNOSIS — Z01419 Encounter for gynecological examination (general) (routine) without abnormal findings: Secondary | ICD-10-CM | POA: Diagnosis present

## 2016-11-16 DIAGNOSIS — Z1151 Encounter for screening for human papillomavirus (HPV): Secondary | ICD-10-CM | POA: Insufficient documentation

## 2016-11-16 NOTE — Addendum Note (Signed)
Addended by: Henri MedalHARTSELL, Kristeena Meineke M on: 11/16/2016 10:23 AM   Modules accepted: Orders

## 2016-11-17 ENCOUNTER — Other Ambulatory Visit: Payer: Self-pay | Admitting: Family Medicine

## 2016-11-17 LAB — CYTOLOGY - PAP
Diagnosis: NEGATIVE
HPV: NOT DETECTED

## 2016-11-17 LAB — CULTURE, OB URINE

## 2016-11-17 MED ORDER — CEPHALEXIN 500 MG PO CAPS: 500.0000 mg | ORAL_CAPSULE | Freq: Four times a day (QID) | ORAL | 0 refills | Status: AC

## 2016-11-17 MED ORDER — CEPHALEXIN 500 MG PO CAPS: 500.0000 mg | ORAL_CAPSULE | Freq: Four times a day (QID) | ORAL | 0 refills | Status: DC

## 2016-11-17 NOTE — Progress Notes (Signed)
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 Patient reported at her initial OB appt on Monday that she did complete the course of antibiotics. However, when I called her today, she reports that she only took half of the Keflex. Will not consider this treatment failure, and I will redose Keflex again today. Patient voiced understanding of such and says she will complete the course.

## 2016-11-19 ENCOUNTER — Encounter (HOSPITAL_COMMUNITY): Payer: Self-pay | Admitting: Family Medicine

## 2016-11-29 ENCOUNTER — Ambulatory Visit (HOSPITAL_COMMUNITY): Payer: Medicaid Other

## 2016-12-03 ENCOUNTER — Other Ambulatory Visit: Payer: Self-pay | Admitting: Family Medicine

## 2016-12-03 ENCOUNTER — Ambulatory Visit (HOSPITAL_COMMUNITY)
Admission: RE | Admit: 2016-12-03 | Discharge: 2016-12-03 | Disposition: A | Discharge status: Home / Self Care | Payer: Medicaid Other | Source: Ambulatory Visit | Attending: Family Medicine | Admitting: Family Medicine

## 2016-12-03 DIAGNOSIS — Z3686 Encounter for antenatal screening for cervical length: Secondary | ICD-10-CM | POA: Diagnosis not present

## 2016-12-03 DIAGNOSIS — Z3A21 21 weeks gestation of pregnancy: Secondary | ICD-10-CM | POA: Diagnosis not present

## 2016-12-03 DIAGNOSIS — Z3482 Encounter for supervision of other normal pregnancy, second trimester: Secondary | ICD-10-CM | POA: Insufficient documentation

## 2016-12-03 DIAGNOSIS — Z3689 Encounter for other specified antenatal screening: Secondary | ICD-10-CM | POA: Insufficient documentation

## 2016-12-15 ENCOUNTER — Other Ambulatory Visit: Payer: Medicaid Other

## 2016-12-15 ENCOUNTER — Encounter: Payer: Medicaid Other | Admitting: Family Medicine

## 2016-12-17 ENCOUNTER — Other Ambulatory Visit: Payer: Medicaid Other

## 2016-12-17 ENCOUNTER — Ambulatory Visit (INDEPENDENT_AMBULATORY_CARE_PROVIDER_SITE_OTHER): Payer: Medicaid Other | Admitting: Family Medicine

## 2016-12-17 ENCOUNTER — Other Ambulatory Visit (HOSPITAL_COMMUNITY)
Admission: RE | Admit: 2016-12-17 | Discharge: 2016-12-17 | Disposition: A | Discharge status: Home / Self Care | Payer: Medicaid Other | Source: Ambulatory Visit | Attending: Family Medicine | Admitting: Family Medicine

## 2016-12-17 VITALS — BP 108/68 | HR 74 | Temp 98.5°F | Wt 119.0 lb

## 2016-12-17 DIAGNOSIS — O35EXX0 Maternal care for other (suspected) fetal abnormality and damage, fetal genitourinary anomalies, not applicable or unspecified: Secondary | ICD-10-CM

## 2016-12-17 DIAGNOSIS — O358XX0 Maternal care for other (suspected) fetal abnormality and damage, not applicable or unspecified: Secondary | ICD-10-CM

## 2016-12-17 DIAGNOSIS — Z3482 Encounter for supervision of other normal pregnancy, second trimester: Secondary | ICD-10-CM

## 2016-12-17 DIAGNOSIS — Z113 Encounter for screening for infections with a predominantly sexual mode of transmission: Secondary | ICD-10-CM | POA: Insufficient documentation

## 2016-12-17 LAB — POCT URINALYSIS DIPSTICK
Bilirubin, UA: NEGATIVE
Blood, UA: NEGATIVE
Glucose, UA: NEGATIVE
Ketones, UA: NEGATIVE
NITRITE UA: NEGATIVE
PH UA: 7
PROTEIN UA: NEGATIVE
Spec Grav, UA: 1.02
UROBILINOGEN UA: 0.2

## 2016-12-17 LAB — POCT UA - MICROSCOPIC ONLY

## 2016-12-17 MED ORDER — CEPHALEXIN 500 MG PO CAPS: 500.0000 mg | ORAL_CAPSULE | Freq: Four times a day (QID) | ORAL | 0 refills | Status: AC

## 2016-12-17 NOTE — Progress Notes (Signed)
   CC: routine OB  19-24 WEEKS  Tashena Reuel BoomLynn Nuttle is a 21 y.o. G2P1001 at 917w2d here for routine follow up.  She reports no concerns, denies CTX, LOF, bleeding, or pain.  See flow sheet for details. Toddler and dad present, all excited. Excited for another boy, may name him "Junior." Mom notes that she has been eating 3 meals and 2 snacks, as well as drinking water. Appetite is good.   A/P: Pregnancy at 8217w2d.   Pregnancy issues include low weight gain. She reports that she didn't gain much weight with her toddler until 7 months or so, and he weighed 6lbs 1oz. Has been eating 3 meals per day and additional snacks. Feels appetite is appropriate. Recommended adding ensure or boost 2-3 times per day to try to increase weight gain, along with healthy fats like olive oil or peanut better. Will RTC in 2 weeks to check on weight and fundal height, which was 21 cm today rather than expected 23cm. At anatomy scan, baby was in 34th percentile. Parents appropriately concerned about baby and mom's weight gain.  Reviewed US results - low lying placenta and bilateral borderline pyelectasis. Scheduled repeated for 4 weeks from today.   Asymptomatic bacteruria- patient rx'd keflex at last visit as she only completed half of her prior course. She reports that she was unable to pick up the new rx last visit and called to have us re-write it. No information in our notes about her call. Rewrote Keflex 500mg  QID x 7days today.   Preterm labor precautions reviewed. Follow up 2 weeks.   Loni MuseKate Alegandra Sommers, MD, PGY1 12/17/2016 2:09 PM

## 2016-12-17 NOTE — Patient Instructions (Addendum)
Try to drink 2-3 ensure or boost per day to increase your weight. We will check on baby and your weight in 2 weeks. Be sure to get the antibiotic and finish it before I see you next.   How a Baby Grows During Pregnancy Introduction Pregnancy begins when a female's sperm enters a female's egg (fertilization). This happens in one of the tubes (fallopian tubes) that connect the ovaries to the womb (uterus). The fertilized egg is called an embryo until it reaches 10 weeks. From 10 weeks until birth, it is called a fetus. The fertilized egg moves down the fallopian tube to the uterus. Then it implants into the lining of the uterus and begins to grow. The developing fetus receives oxygen and nutrients through the pregnant woman's bloodstream and the tissues that grow (placenta) to support the fetus. The placenta is the life support system for the fetus. It provides nutrition and removes waste. Learning as much as you can about your pregnancy and how your baby is developing can help you enjoy the experience. It can also make you aware of when there might be a problem and when to ask questions. How long does a typical pregnancy last? A pregnancy usually lasts 280 days, or about 40 weeks. Pregnancy is divided into three trimesters:  First trimester: 0-13 weeks.  Second trimester: 14-27 weeks.  Third trimester: 28-40 weeks. The day when your baby is considered ready to be born (full term) is your estimated date of delivery. How does my baby develop month by month? First month  The fertilized egg attaches to the inside of the uterus.  Some cells will form the placenta. Others will form the fetus.  The arms, legs, brain, spinal cord, lungs, and heart begin to develop.  At the end of the first month, the heart begins to beat. Second month  The bones, inner ear, eyelids, hands, and feet form.  The genitals develop.  By the end of 8 weeks, all major organs are developing. Third month  All of the  internal organs are forming.  Teeth develop below the gums.  Bones and muscles begin to grow. The spine can flex.  The skin is transparent.  Fingernails and toenails begin to form.  Arms and legs continue to grow longer, and hands and feet develop.  The fetus is about 3 in (7.6 cm) long. Fourth month  The placenta is completely formed.  The external sex organs, neck, outer ear, eyebrows, eyelids, and fingernails are formed.  The fetus can hear, swallow, and move its arms and legs.  The kidneys begin to produce urine.  The skin is covered with a white waxy coating (vernix) and very fine hair (lanugo). Fifth month  The fetus moves around more and can be felt for the first time (quickening).  The fetus starts to sleep and wake up and may begin to suck its finger.  The nails grow to the end of the fingers.  The organ in the digestive system that makes bile (gallbladder) functions and helps to digest the nutrients.  If your baby is a girl, eggs are present in her ovaries. If your baby is a boy, testicles start to move down into his scrotum. Sixth month  The lungs are formed, but the fetus is not yet able to breathe.  The eyes open. The brain continues to develop.  Your baby has fingerprints and toe prints. Your baby's hair grows thicker.  At the end of the second trimester, the fetus is about  9 in (22.9 cm) long. Seventh month  The fetus kicks and stretches.  The eyes are developed enough to sense changes in light.  The hands can make a grasping motion.  The fetus responds to sound. Eighth month  All organs and body systems are fully developed and functioning.  Bones harden and taste buds develop. The fetus may hiccup.  Certain areas of the brain are still developing. The skull remains soft. Ninth month  The fetus gains about  lb (0.23 kg) each week.  The lungs are fully developed.  Patterns of sleep develop.  The fetus's head typically moves into a  head-down position (vertex) in the uterus to prepare for birth. If the buttocks move into a vertex position instead, the baby is breech.  The fetus weighs 6-9 lbs (2.72-4.08 kg) and is 19-20 in (48.26-50.8 cm) long. What can I do to have a healthy pregnancy and help my baby develop?  Eating and Drinking  Eat a healthy diet.  Talk with your health care provider to make sure that you are getting the nutrients that you and your baby need.  Visit www.DisposableNylon.be to learn about creating a healthy diet.  Gain a healthy amount of weight during pregnancy as advised by your health care provider. This is usually 25-35 pounds. You may need to:  Gain more if you were underweight before getting pregnant or if you are pregnant with more than one baby.  Gain less if you were overweight or obese when you got pregnant. Medicines and Vitamins  Take prenatal vitamins as directed by your health care provider. These include vitamins such as folic acid, iron, calcium, and vitamin D. They are important for healthy development.  Take medicines only as directed by your health care provider. Read labels and ask a pharmacist or your health care provider whether over-the-counter medicines, supplements, and prescription drugs are safe to take during pregnancy. Activities  Be physically active as advised by your health care provider. Ask your health care provider to recommend activities that are safe for you to do, such as walking or swimming.  Do not participate in strenuous or extreme sports.  Lifestyle  Do not drink alcohol.  Do not use any tobacco products, including cigarettes, chewing tobacco, or electronic cigarettes. If you need help quitting, ask your health care provider.  Do not use illegal drugs. Safety  Avoid exposure to mercury, lead, or other heavy metals. Ask your health care provider about common sources of these heavy metals.  Avoid listeria infection during pregnancy. Follow these  precautions:  Do not eat soft cheeses or deli meats.  Do not eat hot dogs unless they have been warmed up to the point of steaming, such as in the microwave oven.  Do not drink unpasteurized milk.  Avoid toxoplasmosis infection during pregnancy. Follow these precautions:  Do not change your cat's litter box, if you have a cat. Ask someone else to do this for you.  Wear gardening gloves while working in the yard. General Instructions  Keep all follow-up visits as directed by your health care provider. This is important. This includes prenatal care and screening tests.  Manage any chronic health conditions. Work closely with your health care provider to keep conditions, such as diabetes, under control. How do I know if my baby is developing well? At each prenatal visit, your health care provider will do several different tests to check on your health and keep track of your baby's development. These include:  Fundal height.  Your health care provider will measure your growing belly from top to bottom using a tape measure.  Your health care provider will also feel your belly to determine your baby's position.  Heartbeat.  An ultrasound in the first trimester can confirm pregnancy and show a heartbeat, depending on how far along you are.  Your health care provider will check your baby's heart rate at every prenatal visit.  As you get closer to your delivery date, you may have regular fetal heart rate monitoring to make sure that your baby is not in distress.  Second trimester ultrasound.  This ultrasound checks your baby's development. It also indicates your baby's gender. What should I do if I have concerns about my baby's development? Always talk with your health care provider about any concerns that you may have. This information is not intended to replace advice given to you by your health care provider. Make sure you discuss any questions you have with your health care  provider. Document Released: 04/12/2008 Document Revised: 04/01/2016 Document Reviewed: 04/03/2014  2017 Elsevier

## 2016-12-20 ENCOUNTER — Telehealth: Payer: Self-pay | Admitting: Family Medicine

## 2016-12-20 DIAGNOSIS — O98812 Other maternal infectious and parasitic diseases complicating pregnancy, second trimester: Principal | ICD-10-CM

## 2016-12-20 DIAGNOSIS — A749 Chlamydial infection, unspecified: Secondary | ICD-10-CM

## 2016-12-20 DIAGNOSIS — O98819 Other maternal infectious and parasitic diseases complicating pregnancy, unspecified trimester: Secondary | ICD-10-CM

## 2016-12-20 LAB — CULTURE, OB URINE

## 2016-12-20 LAB — CERVICOVAGINAL ANCILLARY ONLY
Chlamydia: POSITIVE — AB
NEISSERIA GONORRHEA: NEGATIVE

## 2016-12-20 MED ORDER — AZITHROMYCIN 250 MG PO TABS: 1000.0000 mg | ORAL_TABLET | Freq: Once | ORAL | 0 refills | Status: AC

## 2016-12-20 NOTE — Assessment & Plan Note (Signed)
Discovered on routine swab. Called patient and discussed results, provided 1g azithro once for both patient and partner. Will need test of cure in 6 weeks.

## 2016-12-20 NOTE — Telephone Encounter (Signed)
Reviewed lab results. Asymptomatic bacteruria persistent as patient has not been able to get/finish several courses of Keflex. Called patient and she confirmed that she had picked up Keflex and was able to teach back the importance of finishing this course.  Loni MuseKate Sharea Guinther, MD

## 2016-12-20 NOTE — Telephone Encounter (Signed)
Swab positive for Chlamydia. Called patient and explained results, offered expedited partner therapy as well. Partner's name is Corene CorneaKhamari Everett DOB 12/21/1990. E-scribed patient's azithromycin 1g once, and called in partner's script. Will plan for test of cure for Chlamydia in 6 weeks.  Loni MuseKate Jhon Mallozzi, MD

## 2016-12-21 ENCOUNTER — Ambulatory Visit: Payer: Medicaid Other | Admitting: Family Medicine

## 2017-01-04 ENCOUNTER — Encounter (HOSPITAL_COMMUNITY): Payer: Self-pay

## 2017-01-04 ENCOUNTER — Encounter: Payer: Medicaid Other | Admitting: Family Medicine

## 2017-01-04 ENCOUNTER — Ambulatory Visit (HOSPITAL_COMMUNITY)
Admission: RE | Admit: 2017-01-04 | Discharge: 2017-01-04 | Disposition: A | Discharge status: Home / Self Care | Payer: Medicaid Other | Source: Ambulatory Visit | Attending: Family Medicine | Admitting: Family Medicine

## 2017-01-04 ENCOUNTER — Other Ambulatory Visit: Payer: Self-pay | Admitting: Family Medicine

## 2017-01-04 ENCOUNTER — Telehealth: Payer: Self-pay | Admitting: Family Medicine

## 2017-01-04 DIAGNOSIS — Z3A25 25 weeks gestation of pregnancy: Secondary | ICD-10-CM | POA: Insufficient documentation

## 2017-01-04 DIAGNOSIS — O283 Abnormal ultrasonic finding on antenatal screening of mother: Secondary | ICD-10-CM | POA: Insufficient documentation

## 2017-01-04 DIAGNOSIS — Z362 Encounter for other antenatal screening follow-up: Secondary | ICD-10-CM | POA: Insufficient documentation

## 2017-01-04 DIAGNOSIS — O4442 Low lying placenta NOS or without hemorrhage, second trimester: Secondary | ICD-10-CM

## 2017-01-04 DIAGNOSIS — O358XX0 Maternal care for other (suspected) fetal abnormality and damage, not applicable or unspecified: Secondary | ICD-10-CM

## 2017-01-04 DIAGNOSIS — O35EXX0 Maternal care for other (suspected) fetal abnormality and damage, fetal genitourinary anomalies, not applicable or unspecified: Secondary | ICD-10-CM

## 2017-01-05 ENCOUNTER — Ambulatory Visit (INDEPENDENT_AMBULATORY_CARE_PROVIDER_SITE_OTHER): Payer: Medicaid Other | Admitting: Family Medicine

## 2017-01-05 VITALS — BP 100/56 | HR 88 | Temp 98.6°F | Wt 124.4 lb

## 2017-01-05 DIAGNOSIS — Z3482 Encounter for supervision of other normal pregnancy, second trimester: Secondary | ICD-10-CM | POA: Diagnosis present

## 2017-01-05 LAB — POCT WET PREP (WET MOUNT)
Clue Cells Wet Prep Whiff POC: POSITIVE
Trichomonas Wet Prep HPF POC: ABSENT

## 2017-01-05 MED ORDER — METRONIDAZOLE 500 MG PO TABS: 500.0000 mg | ORAL_TABLET | Freq: Two times a day (BID) | ORAL | 0 refills | Status: AC

## 2017-01-05 NOTE — Progress Notes (Signed)
    25-27 WEEKS  Anne Vaughn is a 21 y.o. G2P1001 at 6251w0d here for routine follow up.  She reports vaginal discharge, burning and pain starting 1 week after the chlamydia infection was discovered. Completed both keflex and azithro. Partner present today and also finished azithro. F/u US done yesterday- appropriate interval growth with resolution of fetal pyelectasis. See flow sheet for details.  Objective: BP (!) 100/56   Pulse 88   Temp 98.6 F (37 C)   Wt 124 lb 6.4 oz (56.4 kg)   LMP 07/13/2016   BMI 20.08 kg/m   A/P: Pregnancy at 5451w0d.  Doing well.   Pregnancy issues include low weight gain, although appropriate fetal growth. Asymptomatic bacteruria persistent in prior visits due to patient not completing rx. Retesting OB urine culture today for potential test of cure. Needs chlamydia test of cure around 3/20.  Preterm labor and fetal movement precautions reviewed. Follow up 4 weeks for [facility]. [facility] at [facility] due to getting pregnancy medicaid late), this visit should have been scheduled in [facility] OB clinic but was somehow mis-scheduled. 28 weeks labs ordered as future today, instructed patient that these are very important.  Vaginal discharge today - wet prep positive for BV, given rx for metronidazole 500mg  BID x 7 d.   Orders Placed This Encounter  Procedures  . Culture, OB Urine  . CBC    Standing Status:   Future    Standing Expiration Date:   01/05/2018  . RPR    Standing Status:   Future    Standing Expiration Date:   01/05/2018  . Glucose, 1 hour gestational    Standing Status:   Future    Standing Expiration Date:   01/05/2018  . HIV antibody    Standing Status:   Future    Standing Expiration Date:   01/05/2018  . POCT Wet Prep [facility]([place] [place])     Loni MuseKate Danyel Griess, MD, PGY1 01/05/2017 2:58 PM

## 2017-01-05 NOTE — Patient Instructions (Addendum)
It is VERY important that you come back in 2 weeks to get your labs drawn. When you come back in 4 weeks, you will see our faculty OB doctors, not me. Your vaginal discharge is caused by bacterial overgrowth, so take the medicine that I sent in for this.  Third Trimester of Pregnancy The third trimester is from week 29 through week 42, months 7 through 9. This trimester is when your unborn baby (fetus) is growing very fast. At the end of the ninth month, the unborn baby is about 20 inches in length. It weighs about 6-10 pounds. Follow these instructions at home:  Avoid all smoking, herbs, and alcohol. Avoid drugs not approved by your doctor.  Do not use any tobacco products, including cigarettes, chewing tobacco, and electronic cigarettes. If you need help quitting, ask your doctor. You may get counseling or other support to help you quit.  Only take medicine as told by your doctor. Some medicines are safe and some are not during pregnancy.  Exercise only as told by your doctor. Stop exercising if you start having cramps.  Eat regular, healthy meals.  Wear a good support bra if your breasts are tender.  Do not use hot tubs, steam rooms, or saunas.  Wear your seat belt when driving.  Avoid raw meat, uncooked cheese, and liter boxes and soil used by cats.  Take your prenatal vitamins.  Take 1500-2000 milligrams of calcium daily starting at the 20th week of pregnancy until you deliver your baby.  Try taking medicine that helps you poop (stool softener) as needed, and if your doctor approves. Eat more fiber by eating fresh fruit, vegetables, and whole grains. Drink enough fluids to keep your pee (urine) clear or pale yellow.  Take warm water baths (sitz baths) to soothe pain or discomfort caused by hemorrhoids. Use hemorrhoid cream if your doctor approves.  If you have puffy, bulging veins (varicose veins), wear support hose. Raise (elevate) your feet for 15 minutes, 3-4 times a day.  Limit salt in your diet.  Avoid heavy lifting, wear low heels, and sit up straight.  Rest with your legs raised if you have leg cramps or low back pain.  Visit your dentist if you have not gone during your pregnancy. Use a soft toothbrush to brush your teeth. Be gentle when you floss.  You can have sex (intercourse) unless your doctor tells you not to.  Do not travel far distances unless you must. Only do so with your doctor's approval.  Take prenatal classes.  Practice driving to the hospital.  Pack your hospital bag.  Prepare the baby's room.  Go to your doctor visits. Get help if:  You are not sure if you are in labor or if your water has broken.  You are dizzy.  You have mild cramps or pressure in your lower belly (abdominal).  You have a nagging pain in your belly area.  You continue to feel sick to your stomach (nauseous), throw up (vomit), or have watery poop (diarrhea).  You have bad smelling fluid coming from your vagina.  You have pain with peeing (urination). Get help right away if:  You have a fever.  You are leaking fluid from your vagina.  You are spotting or bleeding from your vagina.  You have severe belly cramping or pain.  You lose or gain weight rapidly.  You have trouble catching your breath and have chest pain.  You notice sudden or extreme puffiness (swelling) of your face,  hands, ankles, feet, or legs.  You have not felt the baby move in over an hour.  You have severe headaches that do not go away with medicine.  You have vision changes. This information is not intended to replace advice given to you by your health care provider. Make sure you discuss any questions you have with your health care provider. Document Released: 01/19/2010 Document Revised: 04/01/2016 Document Reviewed: 12/26/2012 Elsevier Interactive Patient Education  2017 [employer]

## 2017-01-05 NOTE — Progress Notes (Signed)
Routine

## 2017-01-06 ENCOUNTER — Telehealth: Payer: Self-pay | Admitting: *Deleted

## 2017-01-06 NOTE — Telephone Encounter (Signed)
Received call from Dr. Chanetta Marshallimberlake.  Pts appt on 3/19 should be cancelled.  She only needs to be seen on 3/29 and a followup will be made then.  LMOVM asking for return call.  I will go ahead and cancel appt. Mariella Blackwelder, [name], CMA

## 2017-01-07 ENCOUNTER — Encounter: Payer: Self-pay | Admitting: Family Medicine

## 2017-01-07 LAB — CULTURE, OB URINE
Colony Count: NO GROWTH
Organism ID, Bacteria: NO GROWTH

## 2017-01-19 ENCOUNTER — Other Ambulatory Visit (INDEPENDENT_AMBULATORY_CARE_PROVIDER_SITE_OTHER): Payer: Medicaid Other

## 2017-01-19 DIAGNOSIS — Z3482 Encounter for supervision of other normal pregnancy, second trimester: Secondary | ICD-10-CM | POA: Diagnosis not present

## 2017-01-19 LAB — CBC
HCT: 35.3 % (ref 35.0–45.0)
Hemoglobin: 11.8 g/dL (ref 11.7–15.5)
MCH: 27.6 pg (ref 27.0–33.0)
MCHC: 33.4 g/dL (ref 32.0–36.0)
MCV: 82.5 fL (ref 80.0–100.0)
MPV: 9 fL (ref 7.5–12.5)
PLATELETS: 313 10*3/uL (ref 140–400)
RBC: 4.28 MIL/uL (ref 3.80–5.10)
RDW: 16.1 % — ABNORMAL HIGH (ref 11.0–15.0)
WBC: 8.2 10*3/uL (ref 3.8–10.8)

## 2017-01-19 LAB — POCT 1 HR PRENATAL GLUCOSE: Glucose 1 Hr Prenatal, POC: 121 mg/dL

## 2017-01-20 LAB — RPR

## 2017-01-20 LAB — HIV ANTIBODY (ROUTINE TESTING W REFLEX): HIV 1&2 Ab, 4th Generation: NONREACTIVE

## 2017-01-24 ENCOUNTER — Encounter: Payer: Medicaid Other | Admitting: Family Medicine

## 2017-02-03 ENCOUNTER — Telehealth: Payer: Self-pay | Admitting: Family Medicine

## 2017-02-03 ENCOUNTER — Ambulatory Visit (INDEPENDENT_AMBULATORY_CARE_PROVIDER_SITE_OTHER): Payer: Medicaid Other | Admitting: Internal Medicine

## 2017-02-03 ENCOUNTER — Other Ambulatory Visit (HOSPITAL_COMMUNITY)
Admission: RE | Admit: 2017-02-03 | Discharge: 2017-02-03 | Disposition: A | Discharge status: Home / Self Care | Payer: Medicaid Other | Source: Ambulatory Visit | Attending: Family Medicine | Admitting: Family Medicine

## 2017-02-03 VITALS — BP 110/58 | HR 76 | Temp 98.1°F | Wt 126.6 lb

## 2017-02-03 DIAGNOSIS — Z23 Encounter for immunization: Secondary | ICD-10-CM | POA: Diagnosis not present

## 2017-02-03 DIAGNOSIS — Z3482 Encounter for supervision of other normal pregnancy, second trimester: Secondary | ICD-10-CM | POA: Diagnosis not present

## 2017-02-03 LAB — POCT WET PREP (WET MOUNT)
Clue Cells Wet Prep Whiff POC: NEGATIVE
TRICHOMONAS WET PREP HPF POC: ABSENT

## 2017-02-03 MED ORDER — TERCONAZOLE 0.4 % VA CREA: 1.0000 | TOPICAL_CREAM | Freq: Every day | VAGINAL | 0 refills | Status: DC

## 2017-02-03 MED ORDER — ENSURE COMPLETE SHAKE PO LIQD: ORAL | 1 refills | Status: DC

## 2017-02-03 NOTE — Patient Instructions (Addendum)
Please make a follow up with Dr. Chanetta Marshall in 2 weeks.   Fetal Movement Counts Patient Name: ________________________________________________ Patient Due Date: ____________________ What is a fetal movement count? A fetal movement count is the number of times that you feel your baby move during a certain amount of time. This may also be called a fetal kick count. A fetal movement count is recommended for every pregnant woman. You may be asked to start counting fetal movements as early as week 28 of your pregnancy. Pay attention to when your baby is most active. You may notice your baby's sleep and wake cycles. You may also notice things that make your baby move more. You should do a fetal movement count:  When your baby is normally most active.  At the same time each day. A good time to count movements is while you are resting, after having something to eat and drink. How do I count fetal movements? 1. Find a quiet, comfortable area. Sit, or lie down on your side. 2. Write down the date, the start time and stop time, and the number of movements that you felt between those two times. Take this information with you to your health care visits. 3. For 2 hours, count kicks, flutters, swishes, rolls, and jabs. You should feel at least 10 movements during 2 hours. 4. You may stop counting after you have felt 10 movements. 5. If you do not feel 10 movements in 2 hours, have something to eat and drink. Then, keep resting and counting for 1 hour. If you feel at least 4 movements during that hour, you may stop counting. Contact a health care provider if:  You feel fewer than 4 movements in 2 hours.  Your baby is not moving like he or she usually does. Date: ____________ Start time: ____________ Stop time: ____________ Movements: ____________ Date: ____________ Start time: ____________ Stop time: ____________ Movements: ____________ Date: ____________ Start time: ____________ Stop time: ____________  Movements: ____________ Date: ____________ Start time: ____________ Stop time: ____________ Movements: ____________ Date: ____________ Start time: ____________ Stop time: ____________ Movements: ____________ Date: ____________ Start time: ____________ Stop time: ____________ Movements: ____________ Date: ____________ Start time: ____________ Stop time: ____________ Movements: ____________ Date: ____________ Start time: ____________ Stop time: ____________ Movements: ____________ Date: ____________ Start time: ____________ Stop time: ____________ Movements: ____________ This information is not intended to replace advice given to you by your health care provider. Make sure you discuss any questions you have with your health care provider. Document Released: 11/24/2006 Document Revised: 06/23/2016 Document Reviewed: 12/04/2015 Elsevier Interactive Patient Education  2017 [employer].  Below is information about various types of birth control to review: Norethindrone tablets (contraception) What is this medicine? NORETHINDRONE (nor eth IN drone) is an oral contraceptive. The product contains a female hormone known as a progestin. It is used to prevent pregnancy. This medicine may be used for other purposes; ask your health care provider or pharmacist if you have questions. COMMON BRAND NAME(S): Camila, Deblitane 28-Day, Errin, Heather, [place], Jolivette, [place], Nor-QD, Nora-BE, Norlyroc, Ortho Micronor, [employer] 28-Day What should I tell my health care provider before I take this medicine? They need to know if you have any of these conditions: -blood vessel disease or blood clots -breast, cervical, or vaginal cancer -diabetes -heart disease -kidney disease -liver disease -mental depression -migraine -seizures -stroke -vaginal bleeding -an unusual or allergic reaction to norethindrone, other medicines, foods, dyes, or preservatives -pregnant or trying to get  pregnant -breast-feeding How should I use this medicine?  Take this medicine by mouth with a glass of water. You may take it with or without food. Follow the directions on the prescription label. Take this medicine at the same time each day and in the order directed on the package. Do not take your medicine more often than directed. Contact your pediatrician regarding the use of this medicine in children. Special care may be needed. This medicine has been used in female children who have started having menstrual periods. A patient package insert for the product will be given with each prescription and refill. Read this sheet carefully each time. The sheet may change frequently. Overdosage: If you think you have taken too much of this medicine contact a poison control center or emergency room at once. NOTE: This medicine is only for you. Do not share this medicine with others. What if I miss a dose? Try not to miss a dose. Every time you miss a dose or take a dose late your chance of pregnancy increases. When 1 pill is missed (even if only 3 hours late), take the missed pill as soon as possible and continue taking a pill each day at the regular time (use a back up method of birth control for the next 48 hours). If more than 1 dose is missed, use an additional birth control method for the rest of your pill pack until menses occurs. Contact your health care professional if more than 1 dose has been missed. What may interact with this medicine? Do not take this medicine with any of the following medications: -amprenavir or fosamprenavir -bosentan This medicine may also interact with the following medications: -antibiotics or medicines for infections, especially rifampin, rifabutin, rifapentine, and griseofulvin, and possibly penicillins or tetracyclines -aprepitant -barbiturate medicines, such as phenobarbital -carbamazepine -felbamate -modafinil -oxcarbazepine -phenytoin -ritonavir or other  medicines for HIV infection or AIDS -St. John's wort -topiramate This list may not describe all possible interactions. Give your health care provider a list of all the medicines, herbs, non-prescription drugs, or dietary supplements you use. Also tell them if you smoke, drink alcohol, or use illegal drugs. Some items may interact with your medicine. What should I watch for while using this medicine? Visit your doctor or health care professional for regular checks on your progress. You will need a regular breast and pelvic exam and Pap smear while on this medicine. Use an additional method of birth control during the first cycle that you take these tablets. If you have any reason to think you are pregnant, stop taking this medicine right away and contact your doctor or health care professional. If you are taking this medicine for hormone related problems, it may take several cycles of use to see improvement in your condition. This medicine does not protect you against HIV infection (AIDS) or any other sexually transmitted diseases. What side effects may I notice from receiving this medicine? Side effects that you should report to your doctor or health care professional as soon as possible: -breast tenderness or discharge -pain in the abdomen, chest, groin or leg -severe headache -skin rash, itching, or hives -sudden shortness of breath -unusually weak or tired -vision or speech problems -yellowing of skin or eyes Side effects that usually do not require medical attention (report to your doctor or health care professional if they continue or are bothersome): -changes in sexual desire -change in menstrual flow -facial hair growth -fluid retention and swelling -headache -irritability -nausea -weight gain or loss This list may not describe all possible side  effects. Call your doctor for medical advice about side effects. You may report side effects to FDA at 1-800-FDA-1088. Where should I  keep my medicine? Keep out of the reach of children. Store at room temperature between 15 and 30 degrees C (59 and 86 degrees F). Throw away any unused medicine after the expiration date. NOTE: This sheet is a summary. It may not cover all possible information. If you have questions about this medicine, talk to your doctor, pharmacist, or health care provider.  2018 Elsevier/Gold Standard (2012-07-14 16:41:35)    Contraception Choices Contraception, also called birth control, means things to use or ways to try not to get pregnant. Hormonal birth control  This kind of birth control uses hormones. Here are some types of hormonal birth control:  A tube that is put under skin of the arm (implant). The tube can stay in for as long as 3 years.  Shots to get every 3 months (injections).  Pills to take every day (birth control pills).  A patch to change 1 time each week for 3 weeks (birth control patch). After that, the patch is taken off for 1 week.  A ring to put in the vagina. The ring is left in for 3 weeks. Then it is taken out of the vagina for 1 week. Then a new ring is put in.  Pills to take after unprotected sex (emergency birth control pills). Barrier birth control  Here are some types of barrier birth control:  A thin covering that is put on the penis before sex (female condom). The covering is thrown away after sex.  A soft, loose covering that is put in the vagina before sex (female condom). The covering is thrown away after sex.  A rubber bowl that sits over the cervix (diaphragm). The bowl must be made for you. The bowl is put into the vagina before sex. The bowl is left in for 6-8 hours after sex. It is taken out within 24 hours.  A small, soft cup that fits over the cervix (cervical cap). The cup must be made for you. The cup can be left in for 6-8 hours after sex. It is taken out within 48 hours.  A sponge that is put into the vagina before sex. It must be left in for at  least 6 hours after sex. It must be taken out within 30 hours. Then it is thrown away.  A chemical that kills or stops sperm from getting into the uterus (spermicide). It may be a pill, cream, jelly, or foam to put in the vagina. The chemical should be used at least 10-15 minutes before sex. IUD (intrauterine) birth control An IUD is a small, T-shaped piece of plastic. It is put inside the uterus. There are two kinds:  Hormone IUD. This kind can stay in for 3-5 years.  Copper IUD. This kind can stay in for 10 years. Permanent birth control Here are some types of permanent birth control:  Surgery to block the fallopian tubes.  Having an insert put into each fallopian tube.  Surgery to tie off the tubes that carry sperm (vasectomy). Natural planning birth control Here are some types of natural planning birth control:  Not having sex on the days the woman could get pregnant.  Using a calendar:  To keep track of the length of each period.  To find out what days pregnancy can happen.  To plan to not have sex on days when pregnancy can happen.  Watching  for symptoms of ovulation and not having sex during ovulation. One way the woman can check for ovulation is to check her temperature.  Waiting to have sex until after ovulation. Summary  Contraception, also called birth control, means things to use or ways to try not to get pregnant.  Hormonal methods of birth control include implants, injections, pills, patches, vaginal rings, and emergency birth control pills.  Barrier methods of birth control can include female condoms, female condoms, diaphragms, cervical caps, sponges, and spermicides.  There are two types of IUD (intrauterine device) birth control. An IUD can be put in a woman's uterus to prevent pregnancy for 3-5 years.  Permanent sterilization can be done through a procedure for males, females, or both.  Natural planning methods involve not having sex on the days when the  woman could get pregnant. This information is not intended to replace advice given to you by your health care provider. Make sure you discuss any questions you have with your health care provider. Document Released: 08/22/2009 Document Revised: 11/04/2016 Document Reviewed: 11/04/2016 Elsevier Interactive Patient Education  2017 [employer].

## 2017-02-03 NOTE — Progress Notes (Signed)
Anne Vaughn is a 21 y.o. G2P1001 at 634w1d by 6WUS for routine follow up. Pregnancy history includes:  history of chlamydia during current pregnancy, low weight gain, asymptomatic bacteriuria during first trimester (treated), Iron deficiency anemia (resolved).   She reports of no contractions, leakage of fluid or vaginal bleeding. Reports of good fetal movement. Reports of thin white vaginal discharge for the past two weeks; she is unsure if this is her normal vaginal discharge. No dysuria. No associated odor.  See flow sheet for details.  FH: 29cm FHT: 137 bpm  A/P: Pregnancy at 514w1d.  Doing well.   Pregnancy issues include: history of chlamydia during current pregnancy, low weight gain, asymptomatic bacteriuria during first trimester (treated), Iron deficiency anemia (resolved).   Infant feeding choice: breast milk first then transition to formula Contraception choice: uncertain. Thinking about OCP vs IUD. Discussed micronor for OCP option due to desire to brest feed. Provided handout with options.  [facility]  Tdapwas given today.  Preterm labor precautions reviewed. Kick counts reviewed. Follow up 2 weeks with Dr. Chanetta Marshallimberlake [facility] Chlamydia testing obtained today PMH form: completed today, PHQ9 score 0. Flu Vaccine: declined  Paper Rx given for Ens[facility]

## 2017-02-03 NOTE — Telephone Encounter (Signed)
Call back message left.  Note: Wet prep shows yeast,.Anne Vaughn.Terazole-7 Escribed to her pharmacy. GC/Chlamydia pending.

## 2017-02-03 NOTE — Addendum Note (Signed)
Addended by: Steva ColderSCOTT, Darly Massi P on: 02/03/2017 10:52 AM   Modules accepted: Orders

## 2017-02-04 LAB — CERVICOVAGINAL ANCILLARY ONLY
CHLAMYDIA, DNA PROBE: NEGATIVE
NEISSERIA GONORRHEA: NEGATIVE

## 2017-02-08 ENCOUNTER — Telehealth: Payer: Self-pay | Admitting: Family Medicine

## 2017-02-08 NOTE — Telephone Encounter (Signed)
Message left to call back.   Need to discuss wet prep and GC/Chlamydia result. I will mail result letter at this point since this is the second attempt at contacting her.

## 2017-02-14 ENCOUNTER — Telehealth: Payer: Self-pay | Admitting: Family Medicine

## 2017-02-14 NOTE — Telephone Encounter (Signed)
Lvm on cell and actually spoke with pt at home number and gave her the below message. Lamonte Sakai, Jovie Swanner D, [place]

## 2017-02-14 NOTE — Telephone Encounter (Signed)
Would like test results from 2 weeks ago

## 2017-02-14 NOTE — Telephone Encounter (Signed)
Gonorrhea and chlamydia were both negative. Please call her back and let her know -  I'm in lecture. Thanks

## 2017-02-18 ENCOUNTER — Ambulatory Visit (INDEPENDENT_AMBULATORY_CARE_PROVIDER_SITE_OTHER): Payer: Medicaid Other | Admitting: Family Medicine

## 2017-02-18 VITALS — BP 110/66 | HR 88 | Temp 98.3°F | Wt 128.2 lb

## 2017-02-18 DIAGNOSIS — O2613 Low weight gain in pregnancy, third trimester: Secondary | ICD-10-CM | POA: Diagnosis not present

## 2017-02-18 NOTE — Progress Notes (Signed)
Anne Vaughn is a 21 y.o. G2P1001 at [redacted]w[redacted]d here for routine follow up. She reports no concerns. Unsure of baby name. Feels  [place] wings, cornbread, broccoli, rice for dinner, hot pocket for breakfast and oatmeal, can't recall lunch. Occasional Ensure when she isn't able to get a regular meal. Has been trying more frequent and small meals throughout the day.    See flow sheet for details.  A/P: Pregnancy at [redacted]w[redacted]d.  Doing well.   Pregnancy issues include low weight gain, repeatedly measures 2cm below GA throughout pregnancy.  Infant feeding choice: breast vs formula- BF older son x 4 months, supply decreased when she started work back. Discussed concerns of feeling odd about having a child at the breast, that it is lots of work at night. Normalized these concerns, recommended [facility] breastfeeding support group. Contraception choice: wants Mirena, did not like hormonal fluctuations with birth control patch. Infant circumcision desired: yes, here  Tdap was not given today. Given last appt.   Preterm labor and fetal movement precautions reviewed. Safe sleep discussed. Follow up 2 weeks. CBC appropriate despite history of anemia.  Weight gain only 13 pounds this pregnancy, 2 lbs since last visit. Has been eating normally. Discussed with Dr. Jolayne Panther [facility] attending), who agreed with growth ultrasound to ensure baby is still on an appropriate growth curve.  Patient also concerned with inability to gain weight while not pregnant. Could consider thyroid workup after delivery. No symptoms of hyperthyroidism at present - not tachycardic, no temperature intolerances, tremor.    1 hr GTT passed.

## 2017-02-18 NOTE — Patient Instructions (Signed)
You are doing a great job! I am ordering an ultrasound of baby to make sure he is growing well. If that is normal, we will continue to watch your weight. Keep up the good work with getting regular meals! Go to the [facility] if you have bleeding, think you broke your water, don't feel him move, or have contractions.   Best,  Dr. Chanetta Marshall

## 2017-03-04 ENCOUNTER — Ambulatory Visit (HOSPITAL_COMMUNITY)
Admission: RE | Admit: 2017-03-04 | Discharge: 2017-03-04 | Disposition: A | Discharge status: Home / Self Care | Payer: Medicaid Other | Source: Ambulatory Visit | Attending: Family Medicine | Admitting: Family Medicine

## 2017-03-04 DIAGNOSIS — Z3A34 34 weeks gestation of pregnancy: Secondary | ICD-10-CM | POA: Diagnosis not present

## 2017-03-04 DIAGNOSIS — O2613 Low weight gain in pregnancy, third trimester: Secondary | ICD-10-CM | POA: Diagnosis not present

## 2017-03-07 ENCOUNTER — Ambulatory Visit (INDEPENDENT_AMBULATORY_CARE_PROVIDER_SITE_OTHER): Payer: Medicaid Other | Admitting: Family Medicine

## 2017-03-07 DIAGNOSIS — Z3483 Encounter for supervision of other normal pregnancy, third trimester: Secondary | ICD-10-CM

## 2017-03-07 NOTE — Patient Instructions (Signed)
Your weight looks great! See you in 2 weeks. If you feel regular contractions for more than 30 minutes, go to the [facility]. You can download an app for timing contractions.  Third Trimester of Pregnancy The third trimester is from week 28 through week 40 (months 7 through 9). The third trimester is a time when the unborn baby (fetus) is growing rapidly. At the end of the ninth month, the fetus is about 20 inches in length and weighs 6-10 pounds. Body changes during your third trimester Your body will continue to go through many changes during pregnancy. The changes vary from woman to woman. During the third trimester:  Your weight will continue to increase. You can expect to gain 25-35 pounds (11-16 kg) by the end of the pregnancy.  You may begin to get stretch marks on your hips, abdomen, and breasts.  You may urinate more often because the fetus is moving lower into your pelvis and pressing on your bladder.  You may develop or continue to have heartburn. This is caused by increased hormones that slow down muscles in the digestive tract.  You may develop or continue to have constipation because increased hormones slow digestion and cause the muscles that push waste through your intestines to relax.  You may develop hemorrhoids. These are swollen veins (varicose veins) in the rectum that can itch or be painful.  You may develop swollen, bulging veins (varicose veins) in your legs.  You may have increased body aches in the pelvis, back, or thighs. This is due to weight gain and increased hormones that are relaxing your joints.  You may have changes in your hair. These can include thickening of your hair, rapid growth, and changes in texture. Some women also have hair loss during or after pregnancy, or hair that feels dry or thin. Your hair will most likely return to normal after your baby is born.  Your breasts will continue to grow and they will continue to become tender. A yellow fluid  (colostrum) may leak from your breasts. This is the first milk you are producing for your baby.  Your belly button may stick out.  You may notice more swelling in your hands, face, or ankles.  You may have increased tingling or numbness in your hands, arms, and legs. The skin on your belly may also feel numb.  You may feel short of breath because of your expanding uterus.  You may have more problems sleeping. This can be caused by the size of your belly, increased need to urinate, and an increase in your body's metabolism.  You may notice the fetus "dropping," or moving lower in your abdomen (lightening).  You may have increased vaginal discharge.  You may notice your joints feel loose and you may have pain around your pelvic bone. What to expect at prenatal visits You will have prenatal exams every 2 weeks until week 36. Then you will have weekly prenatal exams. During a routine prenatal visit:  You will be weighed to make sure you and the baby are growing normally.  Your blood pressure will be taken.  Your abdomen will be measured to track your baby's growth.  The fetal heartbeat will be listened to.  Any test results from the previous visit will be discussed.  You may have a cervical check near your due date to see if your cervix has softened or thinned (effaced).  You will be tested for Group B streptococcus. This happens between 35 and 37 weeks. Your  health care provider may ask you:  What your birth plan is.  How you are feeling.  If you are feeling the baby move.  If you have had any abnormal symptoms, such as leaking fluid, bleeding, severe headaches, or abdominal cramping.  If you are using any tobacco products, including cigarettes, chewing tobacco, and electronic cigarettes.  If you have any questions. Other tests or screenings that may be performed during your third trimester include:  Blood tests that check for low iron levels (anemia).  Fetal testing to  check the health, activity level, and growth of the fetus. Testing is done if you have certain medical conditions or if there are problems during the pregnancy.  Nonstress test (NST). This test checks the health of your baby to make sure there are no signs of problems, such as the baby not getting enough oxygen. During this test, a belt is placed around your belly. The baby is made to move, and its heart rate is monitored during movement. What is false labor? False labor is a condition in which you feel small, irregular tightenings of the muscles in the womb (contractions) that usually go away with rest, changing position, or drinking water. These are called Braxton Hicks contractions. Contractions may last for hours, days, or even weeks before true labor sets in. If contractions come at regular intervals, become more frequent, increase in intensity, or become painful, you should see your health care provider. What are the signs of labor?  Abdominal cramps.  Regular contractions that start at 10 minutes apart and become stronger and more frequent with time.  Contractions that start on the top of the uterus and spread down to the lower abdomen and back.  Increased pelvic pressure and dull back pain.  A watery or bloody mucus discharge that comes from the vagina.  Leaking of amniotic fluid. This is also known as your "water breaking." It could be a slow trickle or a gush. Let your health care provider know if it has a color or strange odor. If you have any of these signs, call your health care provider right away, even if it is before your due date. Follow these instructions at home: Medicines   Follow your health care provider's instructions regarding medicine use. Specific medicines may be either safe or unsafe to take during pregnancy.  Take a prenatal vitamin that contains at least 600 micrograms (mcg) of folic acid.  If you develop constipation, try taking a stool softener if your health  care provider approves. Eating and drinking   Eat a balanced diet that includes fresh fruits and vegetables, whole grains, good sources of protein such as meat, eggs, or tofu, and low-fat dairy. Your health care provider will help you determine the amount of weight gain that is right for you.  Avoid raw meat and uncooked cheese. These carry germs that can cause birth defects in the baby.  If you have low calcium intake from food, talk to your health care provider about whether you should take a daily calcium supplement.  Eat four or five small meals rather than three large meals a day.  Limit foods that are high in fat and processed sugars, such as fried and sweet foods.  To prevent constipation:  Drink enough fluid to keep your urine clear or pale yellow.  Eat foods that are high in fiber, such as fresh fruits and vegetables, whole grains, and beans. Activity   Exercise only as directed by your health care provider. Most  women can continue their usual exercise routine during pregnancy. Try to exercise for 30 minutes at least 5 days a week. Stop exercising if you experience uterine contractions.  Avoid heavy lifting.  Do not exercise in extreme heat or humidity, or at high altitudes.  Wear low-heel, comfortable shoes.  Practice good posture.  You may continue to have sex unless your health care provider tells you otherwise. Relieving pain and discomfort   Take frequent breaks and rest with your legs elevated if you have leg cramps or low back pain.  Take warm sitz baths to soothe any pain or discomfort caused by hemorrhoids. Use hemorrhoid cream if your health care provider approves.  Wear a good support bra to prevent discomfort from breast tenderness.  If you develop varicose veins:  Wear support pantyhose or compression stockings as told by your healthcare provider.  Elevate your feet for 15 minutes, 3-4 times a day. Prenatal care   Write down your questions. Take  them to your prenatal visits.  Keep all your prenatal visits as told by your health care provider. This is important. Safety   Wear your seat belt at all times when driving.  Make a list of emergency phone numbers, including numbers for family, friends, the hospital, and police and fire departments. General instructions   Avoid cat litter boxes and soil used by cats. These carry germs that can cause birth defects in the baby. If you have a cat, ask someone to clean the litter box for you.  Do not travel far distances unless it is absolutely necessary and only with the approval of your health care provider.  Do not use hot tubs, steam rooms, or saunas.  Do not drink alcohol.  Do not use any products that contain nicotine or tobacco, such as cigarettes and e-cigarettes. If you need help quitting, ask your health care provider.  Do not use any medicinal herbs or unprescribed drugs. These chemicals affect the formation and growth of the baby.  Do not douche or use tampons or scented sanitary pads.  Do not cross your legs for long periods of time.  To prepare for the arrival of your baby:  Take prenatal classes to understand, practice, and ask questions about labor and delivery.  Make a trial run to the hospital.  Visit the hospital and tour the maternity area.  Arrange for maternity or paternity leave through employers.  Arrange for family and friends to take care of pets while you are in the hospital.  Purchase a rear-facing car seat and make sure you know how to install it in your car.  Pack your hospital bag.  Prepare the baby's nursery. Make sure to remove all pillows and stuffed animals from the baby's crib to prevent suffocation.  Visit your dentist if you have not gone during your pregnancy. Use a soft toothbrush to brush your teeth and be gentle when you floss. Contact a health care provider if:  You are unsure if you are in labor or if your water has broken.  You  become dizzy.  You have mild pelvic cramps, pelvic pressure, or nagging pain in your abdominal area.  You have lower back pain.  You have persistent nausea, vomiting, or diarrhea.  You have an unusual or bad smelling vaginal discharge.  You have pain when you urinate. Get help right away if:  Your water breaks before 37 weeks.  You have regular contractions less than 5 minutes apart before 37 weeks.  You have a  fever.  You are leaking fluid from your vagina.  You have spotting or bleeding from your vagina.  You have severe abdominal pain or cramping.  You have rapid weight loss or weight gain.  You have shortness of breath with chest pain.  You notice sudden or extreme swelling of your face, hands, ankles, feet, or legs.  Your baby makes fewer than 10 movements in 2 hours.  You have severe headaches that do not go away when you take medicine.  You have vision changes. Summary  The third trimester is from week 28 through week 40, months 7 through 9. The third trimester is a time when the unborn baby (fetus) is growing rapidly.  During the third trimester, your discomfort may increase as you and your baby continue to gain weight. You may have abdominal, leg, and back pain, sleeping problems, and an increased need to urinate.  During the third trimester your breasts will keep growing and they will continue to become tender. A yellow fluid (colostrum) may leak from your breasts. This is the first milk you are producing for your baby.  False labor is a condition in which you feel small, irregular tightenings of the muscles in the womb (contractions) that eventually go away. These are called Braxton Hicks contractions. Contractions may last for hours, days, or even weeks before true labor sets in.  Signs of labor can include: abdominal cramps; regular contractions that start at 10 minutes apart and become stronger and more frequent with time; watery or bloody mucus discharge  that comes from the vagina; increased pelvic pressure and dull back pain; and leaking of amniotic fluid. This information is not intended to replace advice given to you by your health care provider. Make sure you discuss any questions you have with your health care provider. Document Released: 10/19/2001 Document Revised: 04/01/2016 Document Reviewed: 12/26/2012 Elsevier Interactive Patient Education  2017 [employer].

## 2017-03-07 NOTE — Progress Notes (Signed)
Anne Vaughn is a 21 y.o. G2P1001 at [redacted]w[redacted]d here for routine follow up.  She reports no concerns, occasional contractions after long walk. See flow sheet for details.  A/P: Pregnancy at [redacted]w[redacted]d.  Doing well.   Pregnancy issues include low weight gain, but weight gain increased this visit and growth [place] was reassuring.  Infant feeding choice: breast and likely formula  Contraception choice: Mirena Infant circumcision desired: yes  Tdap was not given today.  Preterm labor and fetal movement precautions reviewed. Safe sleep discussed. Follow up 2 weeks.

## 2017-03-24 ENCOUNTER — Ambulatory Visit (INDEPENDENT_AMBULATORY_CARE_PROVIDER_SITE_OTHER): Payer: Medicaid Other | Admitting: Family Medicine

## 2017-03-24 ENCOUNTER — Other Ambulatory Visit (HOSPITAL_COMMUNITY)
Admission: RE | Admit: 2017-03-24 | Discharge: 2017-03-24 | Disposition: A | Discharge status: Home / Self Care | Payer: Medicaid Other | Source: Ambulatory Visit | Attending: Family Medicine | Admitting: Family Medicine

## 2017-03-24 VITALS — BP 122/72 | HR 84 | Temp 98.7°F | Wt 140.0 lb

## 2017-03-24 DIAGNOSIS — Z3483 Encounter for supervision of other normal pregnancy, third trimester: Secondary | ICD-10-CM

## 2017-03-24 NOTE — Progress Notes (Signed)
Anne AbideShaqueena Anne BoomLynn Vaughn is a 21 y.o. G2P1001 at 6234w1d here for routine follow up.  She reports occasional pelvic pressure and occasional contractions. Denies LOF or bleeding. See flow sheet for details.  A/P: Pregnancy at 8534w1d. Doing well.   Pregnancy issues include low weight gain in first and second trimesters - growth scan appropriate and now weight gain normal.  Infant feeding choice: breast then formula Contraception choice: Mirena Infant circumcision desired: yes at [facility]  GBS and gc/chlamydia testing results were conducted today.   Labor and fetal movement precautions reviewed. Follow up 1 week.

## 2017-03-24 NOTE — Patient Instructions (Signed)
Everything looks great! We will call you if any of the labs are abnormal, but otherwise we will see you in 1 week.    Third Trimester of Pregnancy The third trimester is from week 28 through week 40 (months 7 through 9). The third trimester is a time when the unborn baby (fetus) is growing rapidly. At the end of the ninth month, the fetus is about 20 inches in length and weighs 6-10 pounds. Body changes during your third trimester Your body will continue to go through many changes during pregnancy. The changes vary from woman to woman. During the third trimester:  Your weight will continue to increase. You can expect to gain 25-35 pounds (11-16 kg) by the end of the pregnancy.  You may begin to get stretch marks on your hips, abdomen, and breasts.  You may urinate more often because the fetus is moving lower into your pelvis and pressing on your bladder.  You may develop or continue to have heartburn. This is caused by increased hormones that slow down muscles in the digestive tract.  You may develop or continue to have constipation because increased hormones slow digestion and cause the muscles that push waste through your intestines to relax.  You may develop hemorrhoids. These are swollen veins (varicose veins) in the rectum that can itch or be painful.  You may develop swollen, bulging veins (varicose veins) in your legs.  You may have increased body aches in the pelvis, back, or thighs. This is due to weight gain and increased hormones that are relaxing your joints.  You may have changes in your hair. These can include thickening of your hair, rapid growth, and changes in texture. Some women also have hair loss during or after pregnancy, or hair that feels dry or thin. Your hair will most likely return to normal after your baby is born.  Your breasts will continue to grow and they will continue to become tender. A yellow fluid (colostrum) may leak from your breasts. This is the first  milk you are producing for your baby.  Your belly button may stick out.  You may notice more swelling in your hands, face, or ankles.  You may have increased tingling or numbness in your hands, arms, and legs. The skin on your belly may also feel numb.  You may feel short of breath because of your expanding uterus.  You may have more problems sleeping. This can be caused by the size of your belly, increased need to urinate, and an increase in your body's metabolism.  You may notice the fetus "dropping," or moving lower in your abdomen (lightening).  You may have increased vaginal discharge.  You may notice your joints feel loose and you may have pain around your pelvic bone. What to expect at prenatal visits You will have prenatal exams every 2 weeks until week 36. Then you will have weekly prenatal exams. During a routine prenatal visit:  You will be weighed to make sure you and the baby are growing normally.  Your blood pressure will be taken.  Your abdomen will be measured to track your baby's growth.  The fetal heartbeat will be listened to.  Any test results from the previous visit will be discussed.  You may have a cervical check near your due date to see if your cervix has softened or thinned (effaced).  You will be tested for Group B streptococcus. This happens between 35 and 37 weeks. Your health care provider may ask you:  What your birth plan is.  How you are feeling.  If you are feeling the baby move.  If you have had any abnormal symptoms, such as leaking fluid, bleeding, severe headaches, or abdominal cramping.  If you are using any tobacco products, including cigarettes, chewing tobacco, and electronic cigarettes.  If you have any questions. Other tests or screenings that may be performed during your third trimester include:  Blood tests that check for low iron levels (anemia).  Fetal testing to check the health, activity level, and growth of the  fetus. Testing is done if you have certain medical conditions or if there are problems during the pregnancy.  Nonstress test (NST). This test checks the health of your baby to make sure there are no signs of problems, such as the baby not getting enough oxygen. During this test, a belt is placed around your belly. The baby is made to move, and its heart rate is monitored during movement. What is false labor? False labor is a condition in which you feel small, irregular tightenings of the muscles in the womb (contractions) that usually go away with rest, changing position, or drinking water. These are called Braxton Hicks contractions. Contractions may last for hours, days, or even weeks before true labor sets in. If contractions come at regular intervals, become more frequent, increase in intensity, or become painful, you should see your health care provider. What are the signs of labor?  Abdominal cramps.  Regular contractions that start at 10 minutes apart and become stronger and more frequent with time.  Contractions that start on the top of the uterus and spread down to the lower abdomen and back.  Increased pelvic pressure and dull back pain.  A watery or bloody mucus discharge that comes from the vagina.  Leaking of amniotic fluid. This is also known as your "water breaking." It could be a slow trickle or a gush. Let your health care provider know if it has a color or strange odor. If you have any of these signs, call your health care provider right away, even if it is before your due date. Follow these instructions at home: Medicines   Follow your health care provider's instructions regarding medicine use. Specific medicines may be either safe or unsafe to take during pregnancy.  Take a prenatal vitamin that contains at least 600 micrograms (mcg) of folic acid.  If you develop constipation, try taking a stool softener if your health care provider approves. Eating and drinking    Eat a balanced diet that includes fresh fruits and vegetables, whole grains, good sources of protein such as meat, eggs, or tofu, and low-fat dairy. Your health care provider will help you determine the amount of weight gain that is right for you.  Avoid raw meat and uncooked cheese. These carry germs that can cause birth defects in the baby.  If you have low calcium intake from food, talk to your health care provider about whether you should take a daily calcium supplement.  Eat four or five small meals rather than three large meals a day.  Limit foods that are high in fat and processed sugars, such as fried and sweet foods.  To prevent constipation:  Drink enough fluid to keep your urine clear or pale yellow.  Eat foods that are high in fiber, such as fresh fruits and vegetables, whole grains, and beans. Activity   Exercise only as directed by your health care provider. Most women can continue their usual exercise routine  during pregnancy. Try to exercise for 30 minutes at least 5 days a week. Stop exercising if you experience uterine contractions.  Avoid heavy lifting.  Do not exercise in extreme heat or humidity, or at high altitudes.  Wear low-heel, comfortable shoes.  Practice good posture.  You may continue to have sex unless your health care provider tells you otherwise. Relieving pain and discomfort   Take frequent breaks and rest with your legs elevated if you have leg cramps or low back pain.  Take warm sitz baths to soothe any pain or discomfort caused by hemorrhoids. Use hemorrhoid cream if your health care provider approves.  Wear a good support bra to prevent discomfort from breast tenderness.  If you develop varicose veins:  Wear support pantyhose or compression stockings as told by your healthcare provider.  Elevate your feet for 15 minutes, 3-4 times a day. Prenatal care   Write down your questions. Take them to your prenatal visits.  Keep all your  prenatal visits as told by your health care provider. This is important. Safety   Wear your seat belt at all times when driving.  Make a list of emergency phone numbers, including numbers for family, friends, the hospital, and police and fire departments. General instructions   Avoid cat litter boxes and soil used by cats. These carry germs that can cause birth defects in the baby. If you have a cat, ask someone to clean the litter box for you.  Do not travel far distances unless it is absolutely necessary and only with the approval of your health care provider.  Do not use hot tubs, steam rooms, or saunas.  Do not drink alcohol.  Do not use any products that contain nicotine or tobacco, such as cigarettes and e-cigarettes. If you need help quitting, ask your health care provider.  Do not use any medicinal herbs or unprescribed drugs. These chemicals affect the formation and growth of the baby.  Do not douche or use tampons or scented sanitary pads.  Do not cross your legs for long periods of time.  To prepare for the arrival of your baby:  Take prenatal classes to understand, practice, and ask questions about labor and delivery.  Make a trial run to the hospital.  Visit the hospital and tour the maternity area.  Arrange for maternity or paternity leave through employers.  Arrange for family and friends to take care of pets while you are in the hospital.  Purchase a rear-facing car seat and make sure you know how to install it in your car.  Pack your hospital bag.  Prepare the baby's nursery. Make sure to remove all pillows and stuffed animals from the baby's crib to prevent suffocation.  Visit your dentist if you have not gone during your pregnancy. Use a soft toothbrush to brush your teeth and be gentle when you floss. Contact a health care provider if:  You are unsure if you are in labor or if your water has broken.  You become dizzy.  You have mild pelvic cramps,  pelvic pressure, or nagging pain in your abdominal area.  You have lower back pain.  You have persistent nausea, vomiting, or diarrhea.  You have an unusual or bad smelling vaginal discharge.  You have pain when you urinate. Get help right away if:  Your water breaks before 37 weeks.  You have regular contractions less than 5 minutes apart before 37 weeks.  You have a fever.  You are leaking fluid from  your vagina.  You have spotting or bleeding from your vagina.  You have severe abdominal pain or cramping.  You have rapid weight loss or weight gain.  You have shortness of breath with chest pain.  You notice sudden or extreme swelling of your face, hands, ankles, feet, or legs.  Your baby makes fewer than 10 movements in 2 hours.  You have severe headaches that do not go away when you take medicine.  You have vision changes. Summary  The third trimester is from week 28 through week 40, months 7 through 9. The third trimester is a time when the unborn baby (fetus) is growing rapidly.  During the third trimester, your discomfort may increase as you and your baby continue to gain weight. You may have abdominal, leg, and back pain, sleeping problems, and an increased need to urinate.  During the third trimester your breasts will keep growing and they will continue to become tender. A yellow fluid (colostrum) may leak from your breasts. This is the first milk you are producing for your baby.  False labor is a condition in which you feel small, irregular tightenings of the muscles in the womb (contractions) that eventually go away. These are called Braxton Hicks contractions. Contractions may last for hours, days, or even weeks before true labor sets in.  Signs of labor can include: abdominal cramps; regular contractions that start at 10 minutes apart and become stronger and more frequent with time; watery or bloody mucus discharge that comes from the vagina; increased pelvic  pressure and dull back pain; and leaking of amniotic fluid. This information is not intended to replace advice given to you by your health care provider. Make sure you discuss any questions you have with your health care provider. Document Released: 10/19/2001 Document Revised: 04/01/2016 Document Reviewed: 12/26/2012 Elsevier Interactive Patient Education  2017 [employer].

## 2017-03-25 LAB — CERVICOVAGINAL ANCILLARY ONLY
CHLAMYDIA, DNA PROBE: NEGATIVE
NEISSERIA GONORRHEA: NEGATIVE

## 2017-03-28 LAB — OB RESULTS CONSOLE GBS: GBS: NEGATIVE

## 2017-03-28 LAB — CULTURE, BETA STREP (GROUP B ONLY): Strep Gp B Culture: NEGATIVE

## 2017-03-29 ENCOUNTER — Telehealth: Payer: Self-pay | Admitting: Family Medicine

## 2017-03-29 NOTE — Telephone Encounter (Signed)
Please call patient and let her know that her OB labs were normal. Her GBS was negative and her G/C were negative as well. Please also remind her about her upcoming OB appt tomorrow 5/23. Thanks!

## 2017-03-29 NOTE — Telephone Encounter (Signed)
Tried to contact pt to give her the below information but phone only rang and then stopped and no option to LVM. Please inform her if she calls back. [name], [name], [place]

## 2017-03-30 ENCOUNTER — Ambulatory Visit (INDEPENDENT_AMBULATORY_CARE_PROVIDER_SITE_OTHER): Payer: Medicaid Other | Admitting: Internal Medicine

## 2017-03-30 DIAGNOSIS — Z3483 Encounter for supervision of other normal pregnancy, third trimester: Secondary | ICD-10-CM | POA: Diagnosis not present

## 2017-03-30 LAB — CULTURE, BETA STREP (GROUP B ONLY)

## 2017-03-30 NOTE — Telephone Encounter (Signed)
Pt had appointment today and was given this message. Anne SakaiZimmerman Vaughn, [name], [place]

## 2017-03-30 NOTE — Progress Notes (Signed)
Anne Vaughn is a 21 y.o. G2P1001 at 4742w0d for routine follow up.  She reports no concerns. Good FM. Denies LOF, vaginal bleeding, and vaginal discharge. Having 3-4 contractions per day.   See flow sheet for details.  A/P: Pregnancy at 5942w0d.  Doing well.   Pregnancy issues include low weight gain in first and second trimesters - growth scan appropriate and now weight gain normal.Pregnancy issues include low weight gain in first and second trimesters - growth scan appropriate and now weight gain normal. Baby confirmed to be vertex at 37 week appointment.   Infant feeding choice: breastfeed and formula feed  Contraception choice: Likely Mirena but also considering OCPs.  [facility]  GBS/GC/CZ results were reviewed today.  All negative.  Labor precautions reviewed. Kick counts reviewed.

## 2017-03-30 NOTE — Patient Instructions (Addendum)
FALSE LABOR You may feel small, irregular contractions that eventually go away. These are called Braxton Hicks contractions, or false labor. Contractions may last for hours, days, or even weeks before true labor sets in. If contractions come at regular intervals, intensify, or become painful, it is best to be seen by your caregiver.  SIGNS OF LABOR   Menstrual-like cramps.  Contractions that are 5 minutes apart or less.  Contractions that start on the top of the uterus and spread down to the lower abdomen and back.  A sense of increased pelvic pressure or back pain.  A watery or bloody mucus discharge that comes from the vagina. If you have any of these signs before the 37th week of pregnancy, call your caregiver right away. You need to go to the hospital to get checked immediately. HOME CARE INSTRUCTIONS   Avoid all smoking, herbs, alcohol, and unprescribed drugs. These chemicals affect the formation and growth of the baby.  Follow your caregiver's instructions regarding medicine use. There are medicines that are either safe or unsafe to take during pregnancy.  Exercise only as directed by your caregiver. Experiencing uterine cramps is a good sign to stop exercising.  Continue to eat regular, healthy meals.  Wear a good support bra for breast tenderness.  Do not use hot tubs, steam rooms, or saunas.  Wear your seat belt at all times when driving.  Avoid raw meat, uncooked cheese, cat litter boxes, and soil used by cats. These carry germs that can cause birth defects in the baby.  Take your prenatal vitamins.  Try taking a stool softener (if your caregiver approves) if you develop constipation. Eat more high-fiber foods, such as fresh vegetables or fruit and whole grains. Drink plenty of fluids to keep your urine clear or pale yellow.  Take warm sitz baths to soothe any pain or discomfort caused by hemorrhoids. Use hemorrhoid cream if your caregiver approves.  If you develop  varicose veins, wear support hose. Elevate your feet for 15 minutes, 3-4 times a day. Limit salt in your diet.  Avoid heavy lifting, wear low heal shoes, and practice good posture.  Rest a lot with your legs elevated if you have leg cramps or low back pain.  Visit your dentist if you have not gone during your pregnancy. Use a soft toothbrush to brush your teeth and be gentle when you floss.  A sexual relationship may be continued unless your caregiver directs you otherwise.  Do not travel far distances unless it is absolutely necessary and only with the approval of your caregiver.  Take prenatal classes to understand, practice, and ask questions about the labor and delivery.  Make a trial run to the hospital.  Pack your hospital bag.  Prepare the baby's nursery.  Continue to go to all your prenatal visits as directed by your caregiver. SEEK MEDICAL CARE IF:  You are unsure if you are in labor or if your water has broken.  You have dizziness.  You have mild pelvic cramps, pelvic pressure, or nagging pain in your abdominal area.  You have persistent nausea, vomiting, or diarrhea.  You have a bad smelling vaginal discharge.  You have pain with urination. SEEK IMMEDIATE MEDICAL CARE IF:   You have a fever.  You are leaking fluid from your vagina.  You have spotting or bleeding from your vagina.  You have severe abdominal cramping or pain.  You have rapid weight loss or gain.  You have shortness of breath with chest  pain.  You notice sudden or extreme swelling of your face, hands, ankles, feet, or legs.  You have not felt your baby move in over an hour.  You have severe headaches that do not go away with medicine.  You have vision changes.   Call the office [phone]) or go to [facility] if:  You begin to have strong, frequent contractions  Your water breaks.  Sometimes it is a big gush of fluid, sometimes it is just a trickle that keeps getting your  panties wet or running down your legs  You have vaginal bleeding.  It is normal to have a small amount of spotting if your cervix was checked.   You don't feel your baby moving like normal.  If you don't, get you something to eat and drink and lay down and focus on feeling your baby move.  You should feel at least 10 movements in 2 hours.  If you don't, you should call the office or go to [facility].

## 2017-04-02 ENCOUNTER — Inpatient Hospital Stay (HOSPITAL_COMMUNITY): Payer: Medicaid Other | Admitting: Anesthesiology

## 2017-04-02 ENCOUNTER — Inpatient Hospital Stay (HOSPITAL_COMMUNITY)
Admission: AD | Admit: 2017-04-02 | Discharge: 2017-04-04 | DRG: 775 | Disposition: A | Discharge status: Home / Self Care | Payer: Medicaid Other | Source: Ambulatory Visit | Attending: Obstetrics and Gynecology | Admitting: Obstetrics and Gynecology

## 2017-04-02 ENCOUNTER — Encounter (HOSPITAL_COMMUNITY): Payer: Self-pay | Admitting: *Deleted

## 2017-04-02 DIAGNOSIS — Z8249 Family history of ischemic heart disease and other diseases of the circulatory system: Secondary | ICD-10-CM

## 2017-04-02 DIAGNOSIS — Z3A38 38 weeks gestation of pregnancy: Secondary | ICD-10-CM | POA: Diagnosis not present

## 2017-04-02 DIAGNOSIS — O4202 Full-term premature rupture of membranes, onset of labor within 24 hours of rupture: Secondary | ICD-10-CM | POA: Diagnosis present

## 2017-04-02 LAB — CBC
HEMATOCRIT: 30.2 % — AB (ref 36.0–46.0)
HEMOGLOBIN: 9.7 g/dL — AB (ref 12.0–15.0)
MCH: 25.9 pg — AB (ref 26.0–34.0)
MCHC: 32.1 g/dL (ref 30.0–36.0)
MCV: 80.5 fL (ref 78.0–100.0)
Platelets: 325 10*3/uL (ref 150–400)
RBC: 3.75 MIL/uL — ABNORMAL LOW (ref 3.87–5.11)
RDW: 15.4 % (ref 11.5–15.5)
WBC: 8.3 10*3/uL (ref 4.0–10.5)

## 2017-04-02 LAB — POCT FERN TEST

## 2017-04-02 LAB — TYPE AND SCREEN
ABO/RH(D): B POS
ANTIBODY SCREEN: NEGATIVE

## 2017-04-02 MED ORDER — MISOPROSTOL 200 MCG PO TABS
50.0000 ug | ORAL_TABLET | ORAL | Status: DC
  Administered 2017-04-02: 50 ug via ORAL
  Filled 2017-04-02: qty 1

## 2017-04-02 MED ORDER — LACTATED RINGERS IV SOLN: 500.0000 mL | INTRAVENOUS | Status: DC | PRN

## 2017-04-02 MED ORDER — EPHEDRINE 5 MG/ML INJ
10.0000 mg | INTRAVENOUS | Status: DC | PRN
  Filled 2017-04-02: qty 2

## 2017-04-02 MED ORDER — FENTANYL CITRATE (PF) 100 MCG/2ML IJ SOLN
50.0000 ug | INTRAMUSCULAR | Status: DC | PRN
  Administered 2017-04-02: 50 ug via INTRAVENOUS
  Administered 2017-04-02: 100 ug via INTRAVENOUS
  Filled 2017-04-02 (×2): qty 2

## 2017-04-02 MED ORDER — OXYCODONE-ACETAMINOPHEN 5-325 MG PO TABS: 1.0000 | ORAL_TABLET | ORAL | Status: DC | PRN

## 2017-04-02 MED ORDER — PHENYLEPHRINE 40 MCG/ML (10ML) SYRINGE FOR IV PUSH (FOR BLOOD PRESSURE SUPPORT)
80.0000 ug | PREFILLED_SYRINGE | INTRAVENOUS | Status: DC | PRN
  Filled 2017-04-02: qty 5
  Filled 2017-04-02: qty 10

## 2017-04-02 MED ORDER — LACTATED RINGERS IV SOLN: 500.0000 mL | Freq: Once | INTRAVENOUS | Status: DC

## 2017-04-02 MED ORDER — LACTATED RINGERS IV SOLN
INTRAVENOUS | Status: DC
  Administered 2017-04-02: 125 mL/h via INTRAVENOUS
  Administered 2017-04-02: 18:00:00 via INTRAVENOUS

## 2017-04-02 MED ORDER — PHENYLEPHRINE 40 MCG/ML (10ML) SYRINGE FOR IV PUSH (FOR BLOOD PRESSURE SUPPORT)
80.0000 ug | PREFILLED_SYRINGE | INTRAVENOUS | Status: DC | PRN
  Filled 2017-04-02: qty 5

## 2017-04-02 MED ORDER — ACETAMINOPHEN 325 MG PO TABS: 650.0000 mg | ORAL_TABLET | ORAL | Status: DC | PRN

## 2017-04-02 MED ORDER — FLEET ENEMA 7-19 GM/118ML RE ENEM: 1.0000 | ENEMA | RECTAL | Status: DC | PRN

## 2017-04-02 MED ORDER — IBUPROFEN 600 MG PO TABS
600.0000 mg | ORAL_TABLET | Freq: Four times a day (QID) | ORAL | Status: DC
  Administered 2017-04-03 – 2017-04-04 (×6): 600 mg via ORAL
  Filled 2017-04-02 (×5): qty 1

## 2017-04-02 MED ORDER — LIDOCAINE HCL (PF) 1 % IJ SOLN
INTRAMUSCULAR | Status: DC | PRN
  Administered 2017-04-02: 6 mL via EPIDURAL
  Administered 2017-04-02: 5 mL via EPIDURAL

## 2017-04-02 MED ORDER — OXYTOCIN BOLUS FROM INFUSION
500.0000 mL | Freq: Once | INTRAVENOUS | Status: AC
  Administered 2017-04-02: 500 mL via INTRAVENOUS

## 2017-04-02 MED ORDER — ONDANSETRON HCL 4 MG/2ML IJ SOLN: 4.0000 mg | Freq: Four times a day (QID) | INTRAMUSCULAR | Status: DC | PRN

## 2017-04-02 MED ORDER — SOD CITRATE-CITRIC ACID 500-334 MG/5ML PO SOLN: 30.0000 mL | ORAL | Status: DC | PRN

## 2017-04-02 MED ORDER — OXYTOCIN 40 UNITS IN LACTATED RINGERS INFUSION - SIMPLE MED: 2.5000 [IU]/h | INTRAVENOUS | Status: DC

## 2017-04-02 MED ORDER — OXYCODONE-ACETAMINOPHEN 5-325 MG PO TABS: 2.0000 | ORAL_TABLET | ORAL | Status: DC | PRN

## 2017-04-02 MED ORDER — OXYTOCIN 40 UNITS IN LACTATED RINGERS INFUSION - SIMPLE MED
1.0000 m[IU]/min | INTRAVENOUS | Status: DC
  Administered 2017-04-02: 2 m[IU]/min via INTRAVENOUS
  Filled 2017-04-02: qty 1000

## 2017-04-02 MED ORDER — DIPHENHYDRAMINE HCL 50 MG/ML IJ SOLN: 12.5000 mg | INTRAMUSCULAR | Status: DC | PRN

## 2017-04-02 MED ORDER — TERBUTALINE SULFATE 1 MG/ML IJ SOLN
0.2500 mg | Freq: Once | INTRAMUSCULAR | Status: DC | PRN
  Filled 2017-04-02: qty 1

## 2017-04-02 MED ORDER — FENTANYL 2.5 MCG/ML BUPIVACAINE 1/10 % EPIDURAL INFUSION (WH - ANES)
14.0000 mL/h | INTRAMUSCULAR | Status: DC | PRN
  Administered 2017-04-02: 14 mL/h via EPIDURAL
  Filled 2017-04-02: qty 100

## 2017-04-02 MED ORDER — LIDOCAINE HCL (PF) 1 % IJ SOLN
30.0000 mL | INTRAMUSCULAR | Status: DC | PRN
  Filled 2017-04-02: qty 30

## 2017-04-02 NOTE — Progress Notes (Signed)
Dr. Chanetta Marshallimberlake paged to inform regarding patient's admission.

## 2017-04-02 NOTE — Anesthesia Procedure Notes (Signed)
Epidural Patient location during procedure: OB Start time: 04/02/2017 6:17 PM End time: 04/02/2017 6:20 PM  Staffing Anesthesiologist: Leilani AbleHATCHETT, Shalee Paolo Performed: anesthesiologist   Preanesthetic Checklist Completed: patient identified, surgical consent, pre-op evaluation, timeout performed, IV checked, risks and benefits discussed and monitors and equipment checked  Epidural Patient position: sitting Prep: site prepped and draped and DuraPrep Patient monitoring: continuous pulse ox and blood pressure Approach: midline Location: L3-L4 Injection technique: LOR air  Needle:  Needle type: Tuohy  Needle gauge: 17 G Needle length: 9 cm and 9 Needle insertion depth: 7 cm Catheter type: closed end flexible Catheter size: 19 Gauge Catheter at skin depth: 12 cm Test dose: negative and Other  Assessment Sensory level: T9 Events: blood not aspirated, injection not painful, no injection resistance, negative IV test and no paresthesia  Additional Notes Reason for block:procedure for pain

## 2017-04-02 NOTE — Anesthesia Pain Management Evaluation Note (Signed)
  CRNA Pain Management Visit Note  Patient: Anne Vaughn, 21 y.o., female  "Hello I am a [facility] of the anesthesia [facility] at [facility]. We have an anesthesia team available at all times to provide care throughout the hospital, including epidural management and anesthesia for C-section. I don't know your plan for the delivery whether it a natural birth, water birth, IV sedation, nitrous supplementation, doula or epidural, but we want to meet your pain goals."   1.Was your pain managed to your expectations on prior hospitalizations?   Yes   2.What is your expectation for pain management during this hospitalization?     Epidural  3.How can we help you reach that goal? Patient wants to try natural then epidural  Record the patient's initial score and the patient's pain goal.   Pain: 0  Pain Goal: 4 [facility] wants you to be able to say your pain was always managed very well.  Rica RecordsICKELTON,Ericka Marcellus 04/02/2017

## 2017-04-02 NOTE — MAU Note (Signed)
C/o ? leakiing for past 1.5 weeks; increase in leaking this AM around 0430; G2P1; 7656w3d;

## 2017-04-02 NOTE — MAU Provider Note (Signed)
S: Ms. Anne Vaughn is a 21 y.o. G2P1001 at 4055w3d  who presents to MAU today complaining of leaking of fluid for 1.5 wks, but more since 0430. She denies vaginal bleeding. She endorses irregular contractions. She reports normal fetal movement.    O: BP 122/64 (BP Location: Left Arm)   Pulse 87   Temp 98.7 F (37.1 C) (Oral)   Resp 16   Ht 5\' 6"  (1.676 m)   Wt 63.5 kg (140 lb)   LMP 07/13/2016   BMI 22.60 kg/m  GENERAL: Well-developed, well-nourished female in no acute distress.  HEAD: Normocephalic, atraumatic.  CHEST: Normal effort of breathing, regular heart rate ABDOMEN: Soft, nontender, gravid PELVIC: Normal external female genitalia. Vagina is pink and rugated. Cervix with normal contour, no lesions. Normal discharge.  (+) pooling.   Cervical exam:  Dilation: Closed Station: -3 Exam by:: Morrison Oldee Carter RN   Fetal Monitoring: Baseline: 145 bpm Variability: moderate Accelerations: present Decelerations: absent Contractions: irregular  Results for orders placed or performed during the hospital encounter of 04/02/17 (from the past 24 hour(s))  Fern Test     Status: Normal (Preliminary result)   Collection Time: 04/02/17  7:37 AM  Result Value Ref Range   POCT Fern Test NEGATIVE   POCT fern test     Status: Abnormal   Collection Time: 04/02/17  8:07 AM  Result Value Ref Range   POCT Fern Test POSITIVE      A: SIUP at 2955w3d  SROM since 0430  P: Admit to L&D GBS Negative  Anne Vaughn, CNM 04/02/2017, 8:20 AM

## 2017-04-02 NOTE — Anesthesia Preprocedure Evaluation (Signed)
Anesthesia Evaluation  Patient identified by MRN, date of birth, ID band Patient awake    Reviewed: Allergy & Precautions, H&P , NPO status , Patient's Chart, lab work & pertinent test results  Airway Mallampati: I  TM Distance: >3 FB Neck ROM: full    Dental no notable dental hx.    Pulmonary neg pulmonary ROS,    Pulmonary exam normal        Cardiovascular negative cardio ROS Normal cardiovascular exam     Neuro/Psych negative neurological ROS  negative psych ROS   GI/Hepatic negative GI ROS, Neg liver ROS,   Endo/Other  negative endocrine ROS  Renal/GU negative Renal ROS     Musculoskeletal   Abdominal Normal abdominal exam  (+)   Peds  Hematology  (+) anemia ,   Anesthesia Other Findings   Reproductive/Obstetrics (+) Pregnancy                             Anesthesia Physical Anesthesia Plan  ASA: II  Anesthesia Plan: Epidural   Post-op Pain Management:    Induction:   Airway Management Planned:   Additional Equipment:   Intra-op Plan:   Post-operative Plan:   Informed Consent: I have reviewed the patients History and Physical, chart, labs and discussed the procedure including the risks, benefits and alternatives for the proposed anesthesia with the patient or authorized representative who has indicated his/her understanding and acceptance.     Plan Discussed with:   Anesthesia Plan Comments:         Anesthesia Quick Evaluation

## 2017-04-02 NOTE — Progress Notes (Signed)
Dr. Chanetta Marshallimberlake returned page and will not be able to come in and manage pt.

## 2017-04-02 NOTE — Progress Notes (Signed)
I confirm that I have verified the information documented in the medical student's note and that I have also personally reperformed the physical exam and all medical decision making activities.  Raelyn MoraRolitta Fariha Goto, CNM 04/02/2017 11:31 PM    Labor Progress Note  Anne Vaughn is a 21 y.o. G2P1001 at 7531w3d  admitted for PROM  S: Patient asked for another push of epidural due to pain, patient is otherwise doing well.   O:  BP 104/74   Pulse 85   Temp 98 F (36.7 C) (Oral)   Resp 16   Ht 5\' 6"  (1.676 m)   Wt 63.5 kg (140 lb)   LMP 07/13/2016   BMI 22.60 kg/m   Total I/O In: -  Out: 200 [Urine:200]  FHT:  FHR: 130 bpm, variability: moderate,  accelerations:  Present,  decelerations:  Absent UC:   regular, every 2 minutes SVE:   Dilation: 5 Effacement (%): 90 Station: -1 Exam by:: Joellyn HaffKim Booker CNM  Pitocin @ 6 mu/min  Labs: Lab Results  Component Value Date   WBC 8.3 04/02/2017   HGB 9.7 (L) 04/02/2017   HCT 30.2 (L) 04/02/2017   MCV 80.5 04/02/2017   PLT 325 04/02/2017    Assessment / Plan: 21 y.o. G2P1001 7531w3d in active labor Augmentation of labor, progressing well  Labor: Progressing normally Fetal Wellbeing:  Category I Pain Control:  Epidural Anticipated MOD:  NSVD  Expectant management   Ivan AnchorsJohn Adeolu [facility] Medical Student 04/02/2017 9:35 PM

## 2017-04-02 NOTE — H&P (Signed)
Anne Vaughn is a 21 y.o. female G2P1001 at 32 weeks presenting for spontaneous rupture of membranes. States she started leaking clear fluid at 0430. Reports some irregular contractions. Denies vaginal bleeding. Reports good fetal movement.   Clinic [facility] [place] [phone] Prenatal Labs  Dating ED trans vag [place] @[redacted]w[redacted]d  EDC 04/13/17 Blood type: B/POS/-- (11/06 1202)   Genetic Screen 1 Screen:   Too late AFP:   Declined  Quad:     NIPS: Antibody:NEG (11/06 1202)  Anatomic [place]  female, bilateral borderline pyelectasis, low lying placenta. Repeat: normal kidneys. 3rd trimester growth appropriate 2/2 maternal low weight gain Rubella: 4.24 (11/06 1202)  GTT Early:               Third trimester: normal RPR: NON REAC (03/14 1558)   Flu vaccine  HBsAg: NEGATIVE (11/06 1202)   TDaP vaccine given                                    Rhogam: n/a HIV: NONREACTIVE (03/14 1558)   Baby Food  breastfeeding then transition to formula                                       GBS:  Neg(For PCN allergy, check sensitivities)  Contraception  Mirena Pap: normal 11/2016  Circumcision  yes- at [facility]   Pediatrician [facility] - [facility]   Support Person FOB Everardo Beals    OB History    Gravida Para Term Preterm AB Living   2 1 1     1    SAB TAB Ectopic Multiple Live Births         0 1     Past Medical History:  Diagnosis Date  . Anemia    Past Surgical History:  Procedure Laterality Date  . NO PAST SURGERIES     Family History: family history includes Cancer in her paternal grandmother; Hypertension in her mother; Mental illness in her father. Social History:  reports that she has never smoked. She has never used smokeless tobacco. She reports that she does not drink alcohol or use drugs.     Maternal Diabetes: No Genetic Screening: Normal Maternal Ultrasounds/Referrals: Normal Fetal Ultrasounds or other Referrals:  None Maternal Substance Abuse:  No Significant Maternal Medications:  None Significant  Maternal Lab Results:  None Other Comments:  None  Review of Systems  Constitutional: Negative.   Respiratory: Negative.  Negative for shortness of breath.   Cardiovascular: Negative.  Negative for chest pain.  Gastrointestinal: Positive for abdominal pain.  Neurological: Negative.  Negative for dizziness and headaches.   Maternal Medical History:  Reason for admission: Rupture of membranes.   Contractions: Frequency: rare.   Duration is approximately 40 seconds.   Perceived severity is mild.    Fetal activity: Perceived fetal activity is normal.   Last perceived fetal movement was within the past hour.    Prenatal complications: no prenatal complications Prenatal Complications - Diabetes: none.    Dilation: Closed Station: -3 Exam by:: Morrison Old RN Blood pressure 136/70, pulse 89, temperature 98.6 F (37 C), temperature source Oral, resp. rate 18, height 5\' 6"  (1.676 m), weight 140 lb (63.5 kg), last menstrual period 07/13/2016, not currently breastfeeding. Maternal Exam:  Uterine Assessment: Contraction strength is mild.  Contraction duration is 40 seconds. Contraction frequency is  rare.   Abdomen: Patient reports no abdominal tenderness. Estimated fetal weight is 7lbs.   Fetal presentation: vertex  Introitus: Normal vulva. Normal vagina.  Ferning test: positive.  Nitrazine test: not done. Amniotic fluid character: clear.  Pelvis: adequate for delivery.   Cervix: Cervix evaluated by digital exam.     Fetal Exam Fetal Monitor Review: Mode: ultrasound.   Baseline rate: 130.  Variability: moderate (6-25 bpm).   Pattern: accelerations present and variable decelerations.    Fetal State Assessment: Category I - tracings are normal.     Physical Exam  Nursing note and vitals reviewed. Constitutional: She is oriented to person, place, and time. She appears well-developed and well-nourished.  HENT:  Head: Normocephalic and atraumatic.  Eyes: Conjunctivae are  normal. No scleral icterus.  Cardiovascular: Normal rate and regular rhythm.   Respiratory: Effort normal. No respiratory distress.  GI: Soft. There is no tenderness.  Neurological: She is alert and oriented to person, place, and time.  Skin: Skin is warm and dry.  Psychiatric: She has a normal mood and affect. Her behavior is normal. Judgment and thought content normal.    Prenatal labs: ABO, Rh: B/POS/-- (11/06 1202) Antibody: NEG (11/06 1202) Rubella: 4.24 (11/06 1202) RPR: NON REAC (03/14 1558)  HBsAg: NEGATIVE (11/06 1202)  HIV: NONREACTIVE (03/14 1558)  GBS:  Negative   Assessment/Plan: IUP at term. SROM. GBS neg.   Admit to birthing suites.  Plan cytotec for cervical ripening and foley bulb when able. Plans epidural for pain management.    Cleone SlimCaroline Neill  SNM 04/02/2017, 9:06 AM  I spoke with and examined patient and agree with resident/PA/SNM's note and plan of care. 7322w3d by 6wk u/s, uncomplicated pregnancy, admitted w/ PROM @ 0430- clear fluid. +FM. Not feeling any uc's. Cat 1FHR. GBS neg. SVE by MAU RN cl/soft/-3. Vtx confirmed by informal transabdominal u/s by me. Plan cytotec 50mcg q4hr po, then foley bulb when able.  Anatomy u/s- initially bilateral borderline pyelectasis and low-lying placenta, both resolved on repeat.  Plans to breast and bottlefeed, outpatient circ, undecided about contraception- discussed. Cheral MarkerKimberly R. Robyn Nohr, CNM, [facility]NP-BC 04/02/2017 12:26 PM

## 2017-04-02 NOTE — Progress Notes (Signed)
Subjective: Patient much more uncomfortable with contractions, requesting epidural  Objective: BP 132/75   Pulse 82   Temp 98.3 F (36.8 C) (Oral)   Resp 18   Ht 5\' 6"  (1.676 m)   Wt 140 lb (63.5 kg)   LMP 07/13/2016   BMI 22.60 kg/m  No intake/output data recorded.  FHT:  FHR: 120 bpm, variability: moderate,  accelerations:  Present,  decelerations:  Absent UC:   regular, every 2-3 minutes  SVE:   Dilation: 5 Effacement (%): 80 Station: -2 Exam by:: Enijah Furr student cnm  Labs: Lab Results  Component Value Date   WBC 8.3 04/02/2017   HGB 9.7 (L) 04/02/2017   HCT 30.2 (L) 04/02/2017   MCV 80.5 04/02/2017   PLT 325 04/02/2017    Assessment / Plan: IUP at term. SROM. GBS neg. Progressing normally.   Plan: continue to manage expectantly. Recheck in 1-2 hours. Epidural for pain management.   Cleone SlimCaroline Mialee Weyman SNM 04/02/2017, 5:36 PM

## 2017-04-02 NOTE — Progress Notes (Signed)
Subjective: Patient reporting more discomfort with contractions, requesting pain medication.   Objective: BP 118/60   Pulse 88   Temp 98.5 F (36.9 C) (Oral)   Resp 16   Ht 5\' 6"  (1.676 m)   Wt 140 lb (63.5 kg)   LMP 07/13/2016   BMI 22.60 kg/m  No intake/output data recorded.  FHT:  FHR: 125 bpm, variability: moderate,  accelerations:  Present,  decelerations:  Absent UC:   regular, every 2-4 minutes  SVE:   Dilation: 3 Effacement (%): 50 Station: -3 Exam by:: Donnie Gedeon student cnm  Labs: Lab Results  Component Value Date   WBC 8.3 04/02/2017   HGB 9.7 (L) 04/02/2017   HCT 30.2 (L) 04/02/2017   MCV 80.5 04/02/2017   PLT 325 04/02/2017    Assessment / Plan: IUP at term. SROM. GBS neg  Patient having painful contractions. Discussed pitocin vs. Expectant management. Patient wishes expectant management and pain medication.  Plan: recheck cervix in 2 hours and plan pitocin if no change.   Anne Vaughn SNM 04/02/2017, 2:39 PM

## 2017-04-02 NOTE — MAU Note (Addendum)
Leaked small amt fld about 0430. Passing gas some and every time I do I leak some pink fld with red flecks in it. Has not had sve with this pregnancy. No ctxs. Some pelvic pressure. States has been leaking off and on for about last 10 days. Told provider this wk at office visit.

## 2017-04-02 NOTE — Progress Notes (Signed)
Patient ID: Anne Vaughn, female   DOB: Mar 24, 1996, 21 y.o.   MRN: [id number] Anne CosShaqueena Lynn Richman is a 21 y.o. G2P1001 at 314w3d admitted for induction of labor due to PROM.  Subjective: Mostly comfortable s/p just getting epidural  Objective: BP 132/75   Pulse 82   Temp 98.3 F (36.8 C) (Oral)   Resp 18   Ht 5\' 6"  (1.676 m)   Wt 63.5 kg (140 lb)   LMP 07/13/2016   BMI 22.60 kg/m  No intake/output data recorded.  FHT:  FHR: 125 bpm, variability: moderate,  accelerations:  Present,  decelerations:  Absent UC:   regular, every 2-5 minutes  SVE:   5/90/-1, vtx   Labs: Lab Results  Component Value Date   WBC 8.3 04/02/2017   HGB 9.7 (L) 04/02/2017   HCT 30.2 (L) 04/02/2017   MCV 80.5 04/02/2017   PLT 325 04/02/2017    Assessment / Plan: IOL d/t PROM, SVE unchanged from last exam, will begin pitocin per protocol  Labor: no change from last exam- begin pit Fetal Wellbeing:  Category I Pain Control:  Epidural Pre-eclampsia: n/a I/D:  n/a Anticipated MOD:  NSVD  Marge DuncansBooker, Uma Jerde Randall CNM, WHNP-BC 04/02/2017, 7:39 PM

## 2017-04-03 ENCOUNTER — Encounter (HOSPITAL_COMMUNITY): Payer: Self-pay

## 2017-04-03 LAB — CBC
HEMATOCRIT: 25.8 % — AB (ref 36.0–46.0)
Hemoglobin: 8.5 g/dL — ABNORMAL LOW (ref 12.0–15.0)
MCH: 26.2 pg (ref 26.0–34.0)
MCHC: 32.9 g/dL (ref 30.0–36.0)
MCV: 79.6 fL (ref 78.0–100.0)
Platelets: 278 10*3/uL (ref 150–400)
RBC: 3.24 MIL/uL — ABNORMAL LOW (ref 3.87–5.11)
RDW: 15.3 % (ref 11.5–15.5)
WBC: 16 10*3/uL — ABNORMAL HIGH (ref 4.0–10.5)

## 2017-04-03 LAB — RPR: RPR Ser Ql: NONREACTIVE

## 2017-04-03 MED ORDER — COCONUT OIL OIL
1.0000 "application " | TOPICAL_OIL | Status: DC | PRN
  Filled 2017-04-03: qty 120

## 2017-04-03 MED ORDER — TETANUS-DIPHTH-ACELL PERTUSSIS 5-2.5-18.5 LF-MCG/0.5 IM SUSP: 0.5000 mL | Freq: Once | INTRAMUSCULAR | Status: DC

## 2017-04-03 MED ORDER — ONDANSETRON HCL 4 MG/2ML IJ SOLN: 4.0000 mg | INTRAMUSCULAR | Status: DC | PRN

## 2017-04-03 MED ORDER — DIPHENHYDRAMINE HCL 25 MG PO CAPS: 25.0000 mg | ORAL_CAPSULE | Freq: Four times a day (QID) | ORAL | Status: DC | PRN

## 2017-04-03 MED ORDER — SENNOSIDES-DOCUSATE SODIUM 8.6-50 MG PO TABS
2.0000 | ORAL_TABLET | ORAL | Status: DC
  Administered 2017-04-03: 2 via ORAL
  Filled 2017-04-03: qty 2

## 2017-04-03 MED ORDER — PRENATAL MULTIVITAMIN CH
1.0000 | ORAL_TABLET | Freq: Every day | ORAL | Status: DC
  Administered 2017-04-03: 1 via ORAL
  Filled 2017-04-03: qty 1

## 2017-04-03 MED ORDER — SIMETHICONE 80 MG PO CHEW: 80.0000 mg | CHEWABLE_TABLET | ORAL | Status: DC | PRN

## 2017-04-03 MED ORDER — WITCH HAZEL-GLYCERIN EX PADS: 1.0000 "application " | MEDICATED_PAD | CUTANEOUS | Status: DC | PRN

## 2017-04-03 MED ORDER — DIBUCAINE 1 % RE OINT: 1.0000 "application " | TOPICAL_OINTMENT | RECTAL | Status: DC | PRN

## 2017-04-03 MED ORDER — BENZOCAINE-MENTHOL 20-0.5 % EX AERO: 1.0000 "application " | INHALATION_SPRAY | CUTANEOUS | Status: DC | PRN

## 2017-04-03 MED ORDER — ONDANSETRON HCL 4 MG PO TABS: 4.0000 mg | ORAL_TABLET | ORAL | Status: DC | PRN

## 2017-04-03 MED ORDER — ACETAMINOPHEN 325 MG PO TABS: 650.0000 mg | ORAL_TABLET | ORAL | Status: DC | PRN

## 2017-04-03 MED ORDER — ZOLPIDEM TARTRATE 5 MG PO TABS: 5.0000 mg | ORAL_TABLET | Freq: Every evening | ORAL | Status: DC | PRN

## 2017-04-03 NOTE — Lactation Note (Signed)
This note was copied from a baby's chart. Lactation Consultation Note  Patient Name: Boy Temple PaciniShaqueena Maddalena ZOXWR'UToday's Date: 04/03/2017   Attempted to visit with mom, mom in the shower. Infant asleep in FOB arms. LC Brochures left at bedside for follow up at a later time.      Maternal Data    Feeding Feeding Type: Breast Fed Length of feed: 10 min  LATCH Score/Interventions                      Lactation Tools Discussed/Used     Consult Status      Silas FloodSharon S Allyse Fregeau 04/03/2017, 6:22 PM

## 2017-04-03 NOTE — Progress Notes (Signed)
Post Partum Day 0  Subjective:  Anne Vaughn is a 21 y.o. [id number] 4765w3d s/p NSVD.  No acute events overnight.  Pt denies problems with ambulating, voiding or po intake.  She denies nausea or vomiting.  Pain is well controlled.  She has not had flatus. She has not had bowel movement.  Lochia Minimal.  Plan for birth control is oral contraceptives (estrogen/progesterone).  Method of Feeding: breast and bottle feeding.  Objective: BP 139/66 (BP Location: Left Arm)   Pulse 71   Temp 98.4 F (36.9 C) (Oral)   Resp 16   Ht 5\' 6"  (1.676 m)   Wt 63.5 kg (140 lb)   LMP 07/13/2016   SpO2 100%   Breastfeeding? Unknown   BMI 22.60 kg/m   Physical Exam:  General: alert, cooperative and no distress Chest: CTAB Heart: RRR no m/r/g Abdomen: soft, nontender Uterine Fundus: fundus firm below umbilicus DVT Evaluation: No evidence of DVT seen on physical exam. Extremities: no LE edema   Recent Labs  04/02/17 0840 04/03/17 0529  HGB 9.7* 8.5*  HCT 30.2* 25.8*    Assessment/Plan:  ASSESSMENT: Anne CosShaqueena Lynn Kille is a 21 y.o. G2P2002 265w3d ppd #0 s/p NSVD  Continue current care   LOS: 1 day   Ardyth HarpsJohn Adeolu [employer] Medical Student 04/03/2017, 7:28 AM

## 2017-04-03 NOTE — Anesthesia Postprocedure Evaluation (Signed)
Anesthesia Post Note  Patient: Anne Vaughn  Procedure(s) Performed: * No procedures listed *  Patient location during evaluation: Mother Baby Anesthesia Type: Epidural Level of consciousness: awake and alert and oriented Pain management: satisfactory to patient Vital Signs Assessment: post-procedure vital signs reviewed and stable Respiratory status: spontaneous breathing and nonlabored ventilation Cardiovascular status: stable Postop Assessment: no headache, no backache, no signs of nausea or vomiting, adequate PO intake and patient able to bend at knees (patient up walking) Anesthetic complications: no        Last Vitals:  Vitals:   04/03/17 0223 04/03/17 0634  BP: (!) 121/57 139/66  Pulse: 67 71  Resp: 16 16  Temp: 37.1 C 36.9 C    Last Pain:  Vitals:   04/03/17 0758  TempSrc:   PainSc: Asleep   Pain Goal: Patients Stated Pain Goal: 3 (04/02/17 2157)               Madison HickmanGREGORY,Tykia Mellone

## 2017-04-04 ENCOUNTER — Ambulatory Visit: Payer: Self-pay

## 2017-04-04 MED ORDER — NORETHINDRONE 0.35 MG PO TABS
1.0000 | ORAL_TABLET | Freq: Every day | ORAL | 11 refills | Status: DC
Start: 2017-04-04 — End: 2018-04-17

## 2017-04-04 MED ORDER — IBUPROFEN 600 MG PO TABS: 600.0000 mg | ORAL_TABLET | Freq: Four times a day (QID) | ORAL | 0 refills | Status: DC

## 2017-04-04 NOTE — Lactation Note (Signed)
This note was copied from a baby's chart. Lactation Consultation Note Experienced mom BF her 8619 month old for 4 months exclusively. Mom has round breast that are starting to fill. Has small knots noted to outter side of breast. Encouraged mom to massage. Has everted nipples, easily expressed colostrum. Mom stating nipples stating to get sore, RN gave coconut oil. Nipples appear to be intact.  Mom BF in cradle position swaddled in blanket. Encouraged to BF STS or w/o blankets that causes space between baby and mom. Assisted in football hold. Mom stated felt better. Heard easy swallows. Discussed cluster feeding, filling breast, engorgement, management and prevention, supply and demand. Gave mom hand pump to relieve filling after BF, and shells to keep nipples everted when milk comes in.  Mom encouraged to feed baby 8-12 times/24 hours and with feeding cues. WH/LC brochure given w/resources, support groups and LC services. Patient Name: Boy Temple PaciniShaqueena Hermance ZOXWR'UToday's Date: 04/04/2017     Maternal Data    Feeding Feeding Type: Breast Fed Length of feed: 10 min  LATCH Score/Interventions                      Lactation Tools Discussed/Used     Consult Status      Moncerrath Berhe G 04/04/2017, 2:09 AM

## 2017-04-04 NOTE — Progress Notes (Signed)
Post discharge chart review completed.  

## 2017-04-04 NOTE — Discharge Summary (Signed)
Obstetric Discharge Summary Reason for Admission: onset of labor Prenatal Procedures: none Intrapartum Procedures: spontaneous vaginal delivery Postpartum Procedures: none Complications-Operative and Postpartum: none Hemoglobin  Date Value Ref Range Status  04/03/2017 8.5 (L) 12.0 - 15.0 g/dL Final   HCT  Date Value Ref Range Status  04/03/2017 25.8 (L) 36.0 - 46.0 % Final    Physical Exam:  General: alert, cooperative, appears stated age and no distress Lochia: appropriate Uterine Fundus: firm Incision: n/a DVT Evaluation: No evidence of DVT seen on physical exam.  Discharge Diagnoses: Term Pregnancy-delivered  Discharge Information: Date: 04/04/2017 Activity: pelvic rest Diet: routine Medications: PNV and Ibuprofen Condition: stable and improved Instructions: refer to practice specific booklet Discharge to: home   Newborn Data: Live born female  Birth Weight: 6 lb 0.1 oz (2724 g) APGAR: 8, 9  Home with mother.  Anne Vaughn 04/04/2017, 6:31 AM

## 2017-04-04 NOTE — Plan of Care (Signed)
Problem: Education: Goal: Knowledge of [facility] Education information/materials will improve G2P2 mom breastfeeding well, no complications, anticipate DC

## 2017-04-04 NOTE — Lactation Note (Signed)
This note was copied from a baby's chart. Lactation Consultation Note  Patient Name: Anne Temple PaciniShaqueena Vaughn HYQMV'HToday's Date: 04/04/2017 Reason for consult: Follow-up assessment;Infant weight loss;Infant < 6lbs (4% weight loss / mom ready for dsch ) Baby is 37 hours old,  LC reviewed doc flow sheets and baby has been consistent at the breast since birth ( Breast feeding range 10 -60 mins )  Latch Score range- 8-9 ), voids and stools WNL for age.  Mom denies soreness, sore nipple and engorgement prevention and tx reviewed.  Per mom has a pump at home , also LC provided a hand pump and shells on 5/27.  LC discussed with mom if her breast are really full to start to express off the 1st breast 5-10 ml  So the baby can handle the let down. Soften 1st breast well, and offer 2nd breast, if the baby only feed  The 1st breast, hand express or pump off the 2nd breast to comfort to prevent engorgement.  Mother informed of post-discharge support and given phone number to the lactation department, including services for phone call assistance; out-patient appointments; and breastfeeding support group. List of other breastfeeding resources in the community given in the handout. Encouraged mother to call for problems or concerns related to breastfeeding.   Maternal Data Has patient been taught Hand Expression?:  (per mom feels comfortable )  Feeding Feeding Type: Breast Fed  LATCH Score/Interventions Latch: Grasps breast easily, tongue down, lips flanged, rhythmical sucking.  Audible Swallowing: A few with stimulation  Type of Nipple: Everted at rest and after stimulation  Comfort (Breast/Nipple): Soft / non-tender     Hold (Positioning): No assistance needed to correctly position infant at breast. Intervention(s): Breastfeeding basics reviewed  LATCH Score: 9  Lactation Tools Discussed/Used     Consult Status Consult Status: Complete Date: 04/04/17    Kathrin GreathouseMargaret Ann Sula Fetterly 04/04/2017, 11:54  AM

## 2017-04-07 ENCOUNTER — Encounter: Payer: Medicaid Other | Admitting: Internal Medicine

## 2017-04-08 ENCOUNTER — Telehealth (HOSPITAL_COMMUNITY): Payer: Self-pay | Admitting: Lactation Services

## 2017-04-08 NOTE — Telephone Encounter (Signed)
Returned mom's call. She wants to know if she can drink wine and breast feed. Informed her she can drink one glass and breast feed after 1-2 hours after consumption. Is she gets drunk it is recommended to pump and dump for 24 hours after last drink. Discussed with mom that if she pumps and dumps infant needs to receive previously pumped breast milk or formula until after the 24 hours of pumping and dumping. Mom with no further questions/concerns.

## 2017-05-18 ENCOUNTER — Encounter: Payer: Self-pay | Admitting: Family Medicine

## 2017-05-18 ENCOUNTER — Ambulatory Visit (INDEPENDENT_AMBULATORY_CARE_PROVIDER_SITE_OTHER): Payer: Medicaid Other | Admitting: Family Medicine

## 2017-05-18 DIAGNOSIS — Z3009 Encounter for other general counseling and advice on contraception: Secondary | ICD-10-CM | POA: Diagnosis not present

## 2017-05-18 NOTE — Progress Notes (Signed)
   CC: postpartum checkup   HPI She reports no issues. Was breastfeeding, but milk "dried up" 3 weeks ago. She had vaginal bleeding for 2 weeks, and thinks she had a period last week. They did have intercourse earlier this week and used a condom. She tried birth control pills for 2 weeks, but she noticed spotting and was afraid that this would mimic the spotting she had with Nexplanon and she self discontinued. She initially reports she wants to just use condoms, but then changes her mind and asks about restarting OCPs.   CC, SH/smoking status, and VS noted  Objective: BP 100/60 (BP Location: Right Arm, Patient Position: Sitting, Cuff Size: Small)   Pulse 69   Temp 98 F (36.7 C) (Oral)   Ht 5\' 6"  (1.676 m)   Wt 128 lb (58.1 kg)   SpO2 99%   BMI 20.66 kg/m  Gen: NAD, alert, cooperative, and pleasant. HEENT: NCAT, EOMI, PERRL CV: RRR, no murmur Resp: CTAB, no wheezes, non-labored Abd: SNTND, BS present, no guarding or organomegaly GU: normal external female genitalia, cervix closed Ext: No edema, warm Neuro: Alert and oriented, Speech clear, No gross deficits  Assessment and plan:  Birth control counseling [facility] Took these x2 weeks and noticed spotting. Now deciding whether she wants to use POPs or condoms. Instructed her to call me if she would like to change to OCPs or has questions.   Health Maintenance: UTD  Loni MuseKate Samanvitha Germany, MD, PGY2 05/19/2017 10:10 AM

## 2017-05-18 NOTE — Patient Instructions (Signed)
It was a pleasure to see you today! Thank you for choosing [facility] for your primary care. Anne Vaughn was seen for postpartum follow up.   Our plans for today were:  You can resume normal activities.   Call me if you need a refill on birth control.   Best,  Dr. Chanetta [facility]

## 2017-05-19 NOTE — Assessment & Plan Note (Signed)
[facility]. Took these x2 weeks and noticed spotting. Now deciding whether she wants to use POPs or condoms. Instructed her to call me if she would like to change to OCPs or has questions.

## 2017-08-26 ENCOUNTER — Telehealth: Payer: Self-pay | Admitting: Family Medicine

## 2017-08-26 DIAGNOSIS — Z3009 Encounter for other general counseling and advice on contraception: Secondary | ICD-10-CM

## 2017-08-26 NOTE — Telephone Encounter (Signed)
[facility] and [facility]. Has medicaid still per her report. [facility] Patient aware that they will call with appt.

## 2017-09-22 ENCOUNTER — Encounter: Payer: Self-pay | Admitting: Obstetrics and Gynecology

## 2017-10-19 ENCOUNTER — Encounter: Payer: Self-pay | Admitting: Obstetrics and Gynecology

## 2017-11-21 NOTE — Telephone Encounter (Signed)
Open encounter clean up.[name], [name], CMA   

## 2018-01-19 ENCOUNTER — Ambulatory Visit: Payer: Medicaid Other | Admitting: Internal Medicine

## 2018-01-19 ENCOUNTER — Encounter: Payer: Self-pay | Admitting: Internal Medicine

## 2018-01-24 ENCOUNTER — Ambulatory Visit: Payer: Medicaid Other | Admitting: Internal Medicine

## 2018-03-06 ENCOUNTER — Ambulatory Visit: Payer: Medicaid Other | Admitting: Family Medicine

## 2018-03-06 ENCOUNTER — Ambulatory Visit: Payer: Medicaid Other | Admitting: Internal Medicine

## 2018-03-10 ENCOUNTER — Other Ambulatory Visit: Payer: Self-pay

## 2018-03-10 ENCOUNTER — Ambulatory Visit (INDEPENDENT_AMBULATORY_CARE_PROVIDER_SITE_OTHER): Payer: Medicaid Other | Admitting: Family Medicine

## 2018-03-10 ENCOUNTER — Encounter: Payer: Self-pay | Admitting: Family Medicine

## 2018-03-10 ENCOUNTER — Other Ambulatory Visit (HOSPITAL_COMMUNITY)
Admission: RE | Admit: 2018-03-10 | Discharge: 2018-03-10 | Disposition: A | Discharge status: Home / Self Care | Payer: Medicaid Other | Source: Ambulatory Visit | Attending: Family Medicine | Admitting: Family Medicine

## 2018-03-10 VITALS — BP 110/60 | HR 64 | Temp 98.7°F | Ht 66.0 in | Wt 120.4 lb

## 2018-03-10 DIAGNOSIS — Z3200 Encounter for pregnancy test, result unknown: Secondary | ICD-10-CM | POA: Diagnosis not present

## 2018-03-10 DIAGNOSIS — Z113 Encounter for screening for infections with a predominantly sexual mode of transmission: Secondary | ICD-10-CM | POA: Diagnosis not present

## 2018-03-10 DIAGNOSIS — Z3009 Encounter for other general counseling and advice on contraception: Secondary | ICD-10-CM

## 2018-03-10 LAB — POCT URINE PREGNANCY: PREG TEST UR: POSITIVE — AB

## 2018-03-10 NOTE — Patient Instructions (Signed)
It was a pleasure to see you today! Thank you for choosing [facility] for your primary care. Anne Vaughn was seen for birth control.   Our plans for today were:  I will call you tomorrow with your labs.   Let's see you Monday for either labs or the nurse visit.     You should return to our clinic to see Dr. Chanetta Marshall in 1 year for physical.   Best,  Dr. Chanetta Marshall

## 2018-03-10 NOTE — Progress Notes (Signed)
   CC: wants birth control   HPI  UPT positive. Patient initially told CMA her LMP was 4/1. When I share my surprise about this, she is surprised as well. She then states, "actually, I lied. I didn't have my period on 4/1, I had an abortion." She had a period on 3/1, has not been taking any birth control because she thought she wanted to have a BTL, but then didn't go to the Gyn appt for this because she wasn't sure that she wanted this. She had a positive home pregnancy test on 4/1. She and her partner (FOB of both boys who are also patients of mine) mutually decided to pursue TAB. They went to [facility] choice on [address]. She did not go to the follow up there because thought everything was fine. Had bleeding for 3 weeks after procedure, which was 4/11. Now with some spotting. No sex after TAB until 2 days ago, unprotected. She says they didn't give her birth control from [facility] and said that she needed to see me.   No pain, no fevers  ROS: Denies CP, SOB, abdominal pain, dysuria, changes in BMs.   CC, SH/smoking status, and VS noted  Objective: BP 110/60   Pulse 64   Temp 98.7 F (37.1 C) (Oral)   Ht  (1.676 m)   Wt 120 lb 6.4 oz (54.6 kg)   LMP 02/06/2018 (Exact Date)   SpO2 99%   BMI 19.43 kg/m  Gen: NAD, alert, cooperative, and pleasant. HEENT: NCAT, EOMI, PERRL CV: RRR, no murmur Resp: CTAB, no wheezes, non-labored Abd: SNTND, BS present, no guarding or organomegaly GU: os easily visualized with dark red scant blood in vaginal vault. No discharge from os, visually closed.  Ext: No edema, warm Neuro: Alert and oriented, Speech clear, No gross deficits  Assessment and plan:  Positive UPT: suspect secondary to recent TAB. Will get beta hcg serum, if negligible will schedule for nurse depo appt on Monday. If elevated, will trend betas and consider trans vag [place].   She wants STI testing, will do both g/c and HIV/RPR.   She is tearful, states this is the first  time she has talked about the TAB to anyone other than partner. Offered [facility], she may want to do this at next visit she says. Normalized her mixed feelings.   Orders Placed This Encounter  Procedures  . HIV antibody  . RPR  . Beta hCG quant (ref lab)  . POCT urine pregnancy    No orders of the defined types were placed in this encounter.   Loni Muse, MD, PGY2 03/11/2018 2:57 PM

## 2018-03-11 ENCOUNTER — Encounter: Payer: Self-pay | Admitting: Family Medicine

## 2018-03-11 ENCOUNTER — Telehealth: Payer: Self-pay | Admitting: Family Medicine

## 2018-03-11 LAB — HIV ANTIBODY (ROUTINE TESTING W REFLEX): HIV Screen 4th Generation wRfx: NONREACTIVE

## 2018-03-11 LAB — BETA HCG QUANT (REF LAB): hCG Quant: 580 m[IU]/mL

## 2018-03-11 LAB — RPR: RPR: NONREACTIVE

## 2018-03-11 NOTE — Telephone Encounter (Signed)
Talked to patient, she was at a wedding and wanted me to call back. Was able to tell her that she needs to go to the MAU tomorrow, will call back later today.

## 2018-03-11 NOTE — Telephone Encounter (Signed)
Reached patient again. She's out of town until Monday morning, she will go to MAU then and I will follow up with her after that. Instructed her to go to the ED if pain or fever or feeling badly.

## 2018-03-11 NOTE — Telephone Encounter (Signed)
Beta elevated to 580 (consistent with 4-6w gestation). Discussed with Dr. Vergie Living, OB on call. He recommended MAU visit tomorrow 5/5 around 3pm to get repeat beta and possible trans vag [place] for ?retained products. Called patient to let her know this plan, no answer on cell or home. I will try to send her a my chart message to check phone number.

## 2018-03-12 ENCOUNTER — Encounter: Payer: Self-pay | Admitting: Family Medicine

## 2018-03-13 ENCOUNTER — Ambulatory Visit: Payer: Medicaid Other

## 2018-03-13 ENCOUNTER — Telehealth: Payer: Self-pay | Admitting: Family Medicine

## 2018-03-13 LAB — CERVICOVAGINAL ANCILLARY ONLY
Chlamydia: NEGATIVE
NEISSERIA GONORRHEA: NEGATIVE

## 2018-03-13 NOTE — Telephone Encounter (Signed)
Called patient again to encourage her to go to the MAU. She is still out of town, states she will be back around 2pm today and will go to MAU then. Canceled RN appt since she will go to the MAU. I verbally FYI'd MAU provider this morning. Also let patient know that her HIV, RPR were negative, awaiting GC results.

## 2018-03-14 ENCOUNTER — Inpatient Hospital Stay (HOSPITAL_COMMUNITY)
Admission: AD | Admit: 2018-03-14 | Discharge: 2018-03-14 | Disposition: A | Discharge status: Home / Self Care | Payer: Medicaid Other | Source: Ambulatory Visit | Attending: Obstetrics & Gynecology | Admitting: Obstetrics & Gynecology

## 2018-03-14 ENCOUNTER — Encounter (HOSPITAL_COMMUNITY): Payer: Self-pay | Admitting: *Deleted

## 2018-03-14 ENCOUNTER — Other Ambulatory Visit: Payer: Self-pay

## 2018-03-14 ENCOUNTER — Inpatient Hospital Stay (HOSPITAL_COMMUNITY): Payer: Medicaid Other

## 2018-03-14 DIAGNOSIS — O034 Incomplete spontaneous abortion without complication: Secondary | ICD-10-CM

## 2018-03-14 DIAGNOSIS — R9389 Abnormal findings on diagnostic imaging of other specified body structures: Secondary | ICD-10-CM | POA: Diagnosis not present

## 2018-03-14 DIAGNOSIS — Z8249 Family history of ischemic heart disease and other diseases of the circulatory system: Secondary | ICD-10-CM | POA: Diagnosis not present

## 2018-03-14 DIAGNOSIS — D649 Anemia, unspecified: Secondary | ICD-10-CM | POA: Diagnosis not present

## 2018-03-14 LAB — CBC
HCT: 36.5 % (ref 36.0–46.0)
HEMOGLOBIN: 11.4 g/dL — AB (ref 12.0–15.0)
MCH: 25.9 pg — ABNORMAL LOW (ref 26.0–34.0)
MCHC: 31.2 g/dL (ref 30.0–36.0)
MCV: 82.8 fL (ref 78.0–100.0)
Platelets: 355 10*3/uL (ref 150–400)
RBC: 4.41 MIL/uL (ref 3.87–5.11)
RDW: 15.5 % (ref 11.5–15.5)
WBC: 5.6 10*3/uL (ref 4.0–10.5)

## 2018-03-14 LAB — HCG, QUANTITATIVE, PREGNANCY: HCG, BETA CHAIN, QUANT, S: 318 m[IU]/mL — AB (ref <5)

## 2018-03-14 MED ORDER — MISOPROSTOL 200 MCG PO TABS: 800.0000 ug | ORAL_TABLET | Freq: Once | ORAL | 0 refills | Status: DC

## 2018-03-14 MED ORDER — IBUPROFEN 800 MG PO TABS: 800.0000 mg | ORAL_TABLET | Freq: Three times a day (TID) | ORAL | 0 refills | Status: DC | PRN

## 2018-03-14 NOTE — MAU Provider Note (Signed)
History     CSN: [id number]  Arrival date and time: 03/14/18 1533   First Provider Initiated Contact with Patient 03/14/18 1630      Chief Complaint  Patient presents with  . Follow-up   Anne Vaughn is a 22 y.o. Who presents today for FU HCG. She reports that she had a TAB at approx [redacted] weeks EGA on 02/16/18. She was seen at [facility] to start [facility], but still have HCG in her blood. It was recommended that she be seen for hcg and [place]. She denies any pain or bleeding today.   Onset: 02/16/18 Location: Duration: Quality:denies bleeding  Severity: 0/10 pain  Context: post surgical TAB  Timing: Aggravating factors: none  Relieving factors: none  Treatments tried: none      Past Medical History:  Diagnosis Date  . Anemia     Past Surgical History:  Procedure Laterality Date  . NO PAST SURGERIES      Family History  Problem Relation Age of Onset  . Hypertension Mother   . Mental illness Father   . Cancer Paternal Grandmother     Social History   Tobacco Use  . Smoking status: Never Smoker  . Smokeless tobacco: Never Used  Substance Use Topics  . Alcohol use: No    Alcohol/week: 0.0 oz  . Drug use: No    Allergies: No Known Allergies  Medications Prior to Admission  Medication Sig Dispense Refill Last Dose  . ibuprofen (ADVIL,MOTRIN) 600 MG tablet Take 1 tablet (600 mg total) by mouth every 6 (six) hours. 30 tablet 0   . norethindrone (CAMILA) 0.35 MG tablet Take 1 tablet (0.35 mg total) by mouth daily. 1 Package 11   . Nutritional Supplements (ENSURE COMPLETE SHAKE) LIQD 1 bottle daily (Patient not taking: Reported on 04/02/2017) 30 Bottle 1 Not Taking at Unknown time  . Prenatal Vit-Min-FA-Fish Oil ([employer] PRENATAL GUMMY PO) Take 2 tablets by mouth daily.       Review of Systems  Constitutional: Negative for chills and fever.  Gastrointestinal: Negative for nausea and vomiting.  Genitourinary: Negative for dysuria, frequency, pelvic pain, urgency, vaginal  bleeding and vaginal discharge.   Physical Exam   Last menstrual period 01/06/2018, unknown if currently breastfeeding.  Physical Exam  Nursing note and vitals reviewed. Constitutional: She is oriented to person, place, and time. She appears well-developed and well-nourished. No distress.  HENT:  Head: Normocephalic.  Cardiovascular: Normal rate.  Respiratory: Effort normal.  GI: Soft. There is no tenderness. There is no rebound.  Neurological: She is alert and oriented to person, place, and time.  Skin: Skin is warm and dry.  Psychiatric: She has a normal mood and affect.   Results for orders placed or performed during the hospital encounter of 03/14/18 (from the past 24 hour(s))  CBC     Status: Abnormal   Collection Time: 03/14/18  4:48 PM  Result Value Ref Range   WBC 5.6 4.0 - 10.5 K/uL   RBC 4.41 3.87 - 5.11 MIL/uL   Hemoglobin 11.4 (L) 12.0 - 15.0 g/dL   HCT 40.9 81.1 - 91.4 %   MCV 82.8 78.0 - 100.0 fL   MCH 25.9 (L) 26.0 - 34.0 pg   MCHC 31.2 30.0 - 36.0 g/dL   RDW 78.2 95.6 - 21.3 %   Platelets 355 150 - 400 K/uL  hCG, quantitative, pregnancy     Status: Abnormal   Collection Time: 03/14/18  4:48 PM  Result Value Ref Range  hCG, Beta Chain, Quant, S 318 (H) <5 mIU/mL   US Ob Comp Less 14 Wks  Result Date: 03/14/2018 CLINICAL DATA:  22 year old female with vaginal bleeding in the 1st trimester of pregnancy. Quantitative beta HCG has reportedly fallen from 580 on 03/10/18 to 318 today. Estimated gestational age by LMP 9 weeks 4 days.  LMP 01/06/2018. EXAM: OBSTETRIC <14 WK [place] AND TRANSVAGINAL OB US TECHNIQUE: Both transabdominal and transvaginal ultrasound examinations were performed for complete evaluation of the gestation as well as the maternal uterus, adnexal regions, and pelvic cul-de-sac. Transvaginal technique was performed to assess early pregnancy. COMPARISON:  None relevant FINDINGS: Intrauterine gestational sac: None Yolk sac:  None Embryo:  None Cardiac  Activity: Not applicable Subchorionic hemorrhage:  None Maternal uterus/adnexae: The left ovary appears normal measuring 2.5 x 3.7 x 2.1 centimeters. The right ovary is larger measuring 5.1 x 2.4 x 2.6 centimeters but appears within normal limits; there are several simple appearing cysts. No adnexal mass or abnormality identified. There is heterogeneous endometrial thickening at the uterine fundus up to 9 millimeters with conspicuous hypervascularity (images 29-31). No pelvic free fluid. IMPRESSION: 1. No intrauterine gestational sac identified. No adnexal abnormality identified. No pelvic free fluid. 2. Findings are suspicious but not yet definitive for failed pregnancy, and heterogeneous endometrial thickening in the uterine fundus may reflect products of conception. 3. Recommend follow-up US in 10-14 days for definitive diagnosis. This recommendation follows SRU consensus guidelines: Diagnostic Criteria for Nonviable Pregnancy Early in the First Trimester. Malva Limes Med 2013; 161:0960-45. Electronically Signed   By: Odessa Fleming M.D.   On: 03/14/2018 18:23   US Ob Transvaginal  Result Date: 03/14/2018 CLINICAL DATA:  22 year old female with vaginal bleeding in the 1st trimester of pregnancy. Quantitative beta HCG has reportedly fallen from 580 on 03/10/18 to 318 today. Estimated gestational age by LMP 9 weeks 4 days.  LMP 01/06/2018. EXAM: OBSTETRIC <14 WK [place] AND TRANSVAGINAL OB US TECHNIQUE: Both transabdominal and transvaginal ultrasound examinations were performed for complete evaluation of the gestation as well as the maternal uterus, adnexal regions, and pelvic cul-de-sac. Transvaginal technique was performed to assess early pregnancy. COMPARISON:  None relevant FINDINGS: Intrauterine gestational sac: None Yolk sac:  None Embryo:  None Cardiac Activity: Not applicable Subchorionic hemorrhage:  None Maternal uterus/adnexae: The left ovary appears normal measuring 2.5 x 3.7 x 2.1 centimeters. The right ovary is  larger measuring 5.1 x 2.4 x 2.6 centimeters but appears within normal limits; there are several simple appearing cysts. No adnexal mass or abnormality identified. There is heterogeneous endometrial thickening at the uterine fundus up to 9 millimeters with conspicuous hypervascularity (images 29-31). No pelvic free fluid. IMPRESSION: 1. No intrauterine gestational sac identified. No adnexal abnormality identified. No pelvic free fluid. 2. Findings are suspicious but not yet definitive for failed pregnancy, and heterogeneous endometrial thickening in the uterine fundus may reflect products of conception. 3. Recommend follow-up US in 10-14 days for definitive diagnosis. This recommendation follows SRU consensus guidelines: Diagnostic Criteria for Nonviable Pregnancy Early in the First Trimester. Malva Limes Med 2013; 409:8119-14. Electronically Signed   By: Odessa Fleming M.D.   On: 03/14/2018 18:23   MAU Course  Procedures  MDM 1841: CW Dr. Adrian Blackwater, will give cytotec and have patient FU in WOC next week for non-stat BHCG.   Assessment and Plan   1. Retained products of conception following abortion    DC home Comfort measures reviewed  Bleeding precautions RX: Cytotec Buccal x  1, ibuprofen  PRN #[id number]  Return to MAU as needed   Follow-up Information    [facility]   Specialty:  Obstetrics and Gynecology Why:  They will call you for an appointment next week  Contact information: [address] [place] [address] [phone]           Thressa Sheller 03/14/2018, 4:33 PM

## 2018-03-14 NOTE — MAU Note (Addendum)
Had a termination 4/11(7wks).  Still having preg hormone. Denies pain, very light spotting.

## 2018-03-14 NOTE — Discharge Instructions (Signed)
°  Follow these instructions at home:  Rest as directed by your health care provider.  Restrict activity as directed by your health care provider. You may be allowed to continue light activity if curettage was not done but you require further treatment.  Keep track of the number of pads you use each day. Keep track of how soaked (saturated) they are. Record this information.  Do not  use tampons.  Do not douche or have sexual intercourse until approved by your health care provider.  Keep all follow-up appointments for reevaluation and continuing management.  Only take over-the-counter or prescription medicines for pain, fever, or discomfort as directed by your health care provider.  Take antibiotic medicine as directed by your health care provider. Make sure you finish it even if you start to feel better. Get help right away if:  You experience severe cramps in your stomach, back, or abdomen.  You have an unexplained temperature (make sure to record these temperatures).  You pass large clots or tissue (save these for your health care provider to inspect).  Your bleeding increases.  You become light-headed, weak, or have fainting episodes. This information is not intended to replace advice given to you by your health care provider. Make sure you discuss any questions you have with your health care provider. Document Released: 10/25/2005 Document Revised: 04/01/2016 Document Reviewed: 05/24/2013 Elsevier Interactive Patient Education  2017 [employer].

## 2018-03-15 ENCOUNTER — Telehealth: Payer: Self-pay | Admitting: General Practice

## 2018-03-15 NOTE — Telephone Encounter (Signed)
Called patient to schedule Lab visit for 03/23/18 at 1:30pm.  Patient voiced understanding.

## 2018-03-22 ENCOUNTER — Other Ambulatory Visit: Payer: Self-pay | Admitting: *Deleted

## 2018-03-22 DIAGNOSIS — O034 Incomplete spontaneous abortion without complication: Secondary | ICD-10-CM

## 2018-03-22 IMAGING — US US MFM OB TRANSVAGINAL
1 series · 14 of 28 positions shown · non-contrast
Comparison: none

[Series 1: us mfm ob transvaginal · 81 acquisitions, 14 frames shown]
[im 3/81]
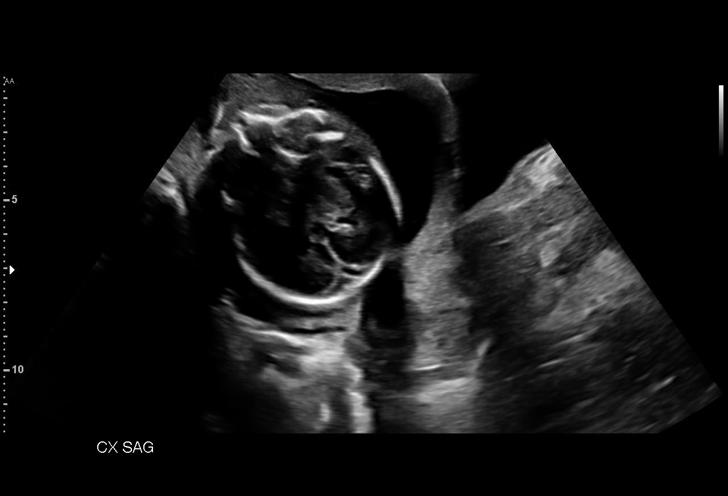
[im 9/81]
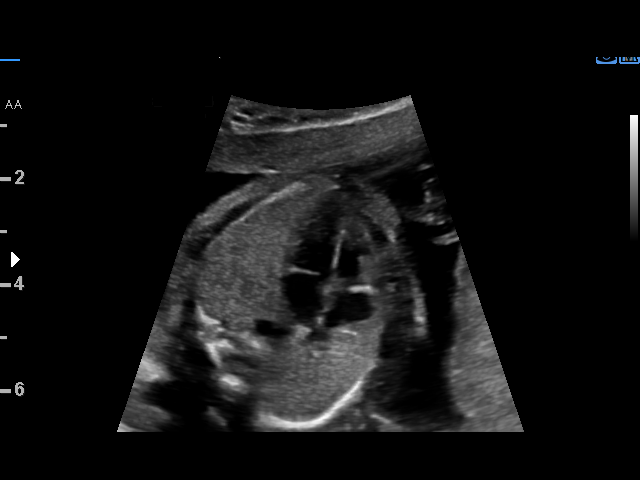
[im 15/81]
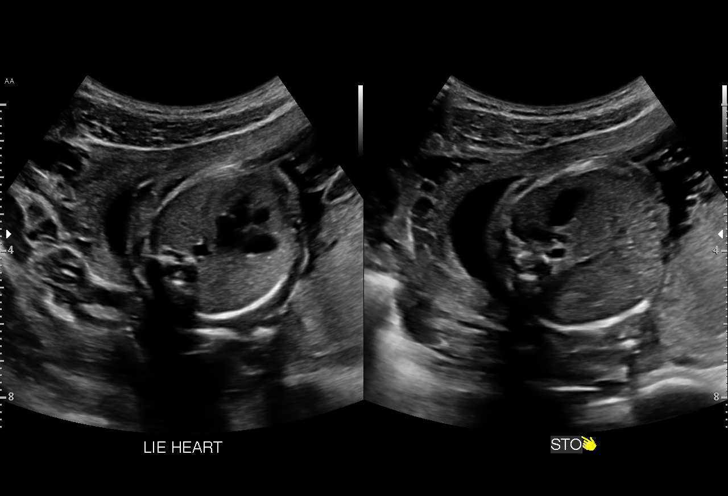
[im 21/81]
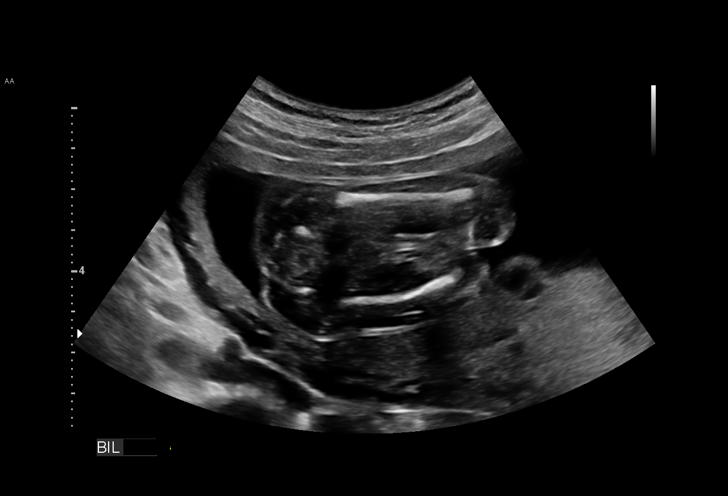
[im 27/81]
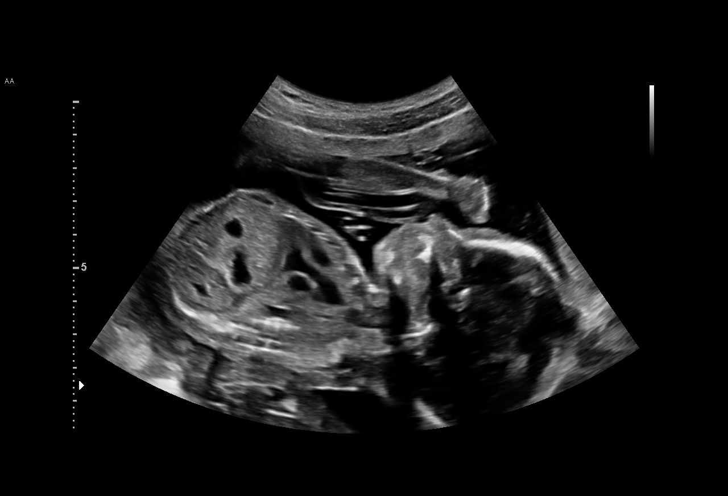
[im 33/81]
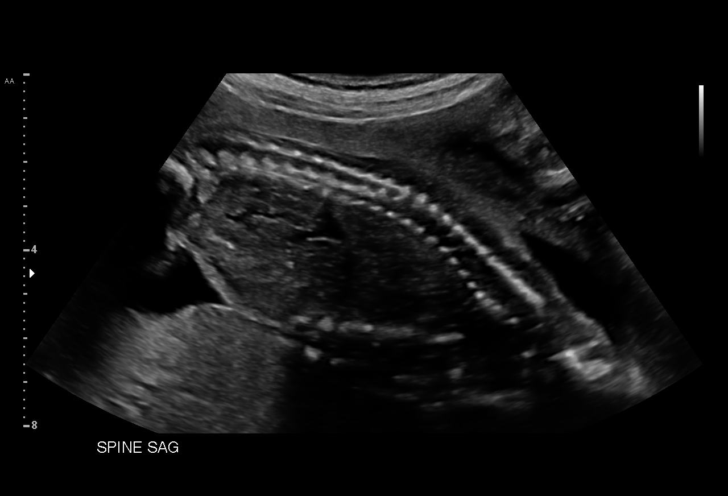
[im 39/81]
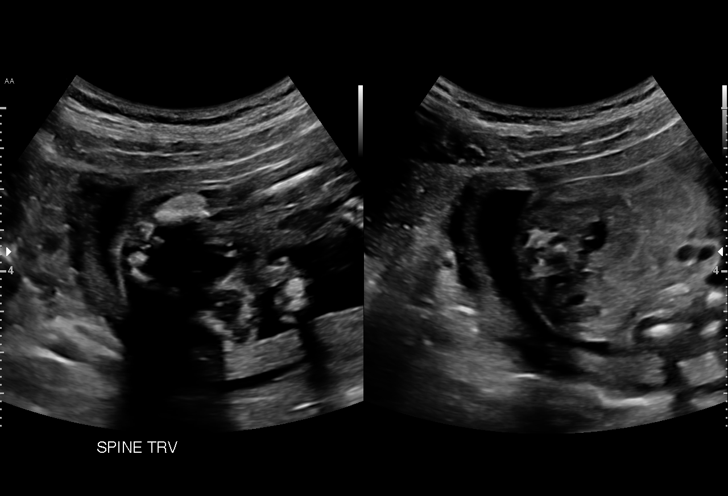
[im 45/81]
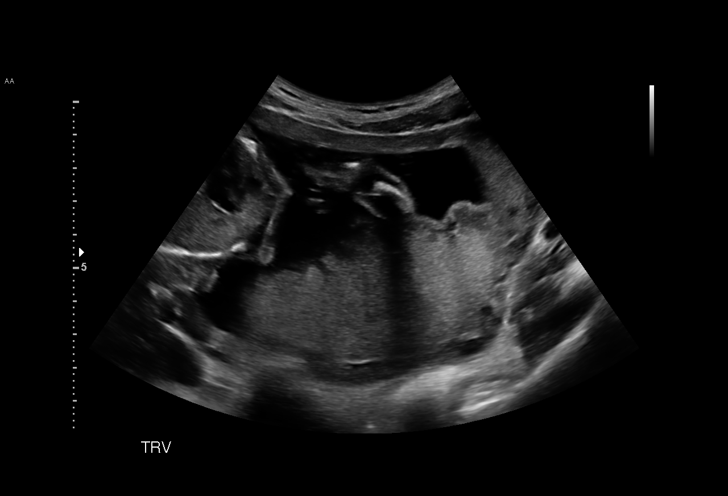
[im 51/81]
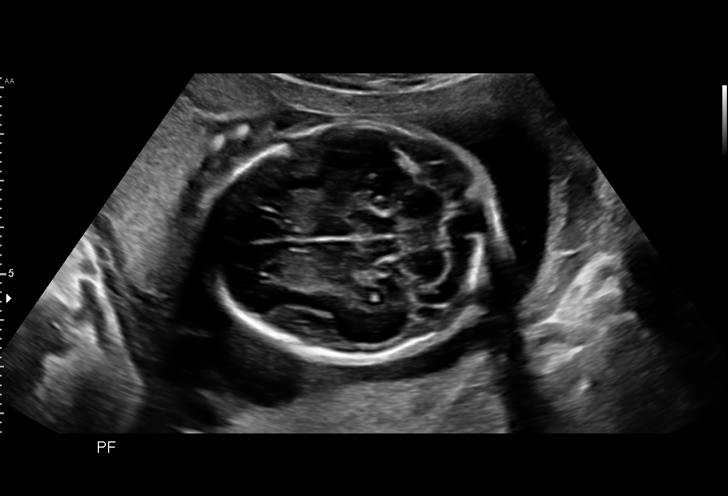
[im 57/81]
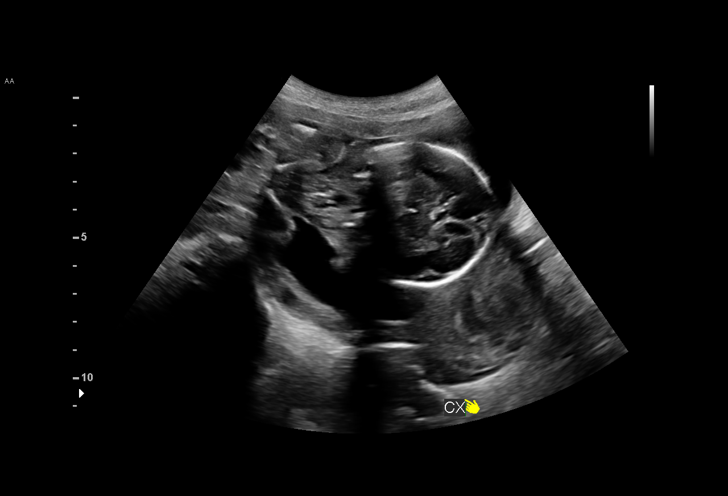
[im 63/81]
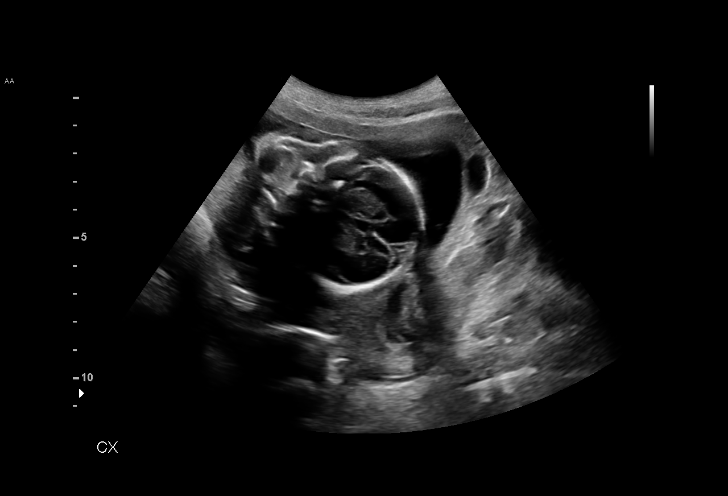
[im 69/81]
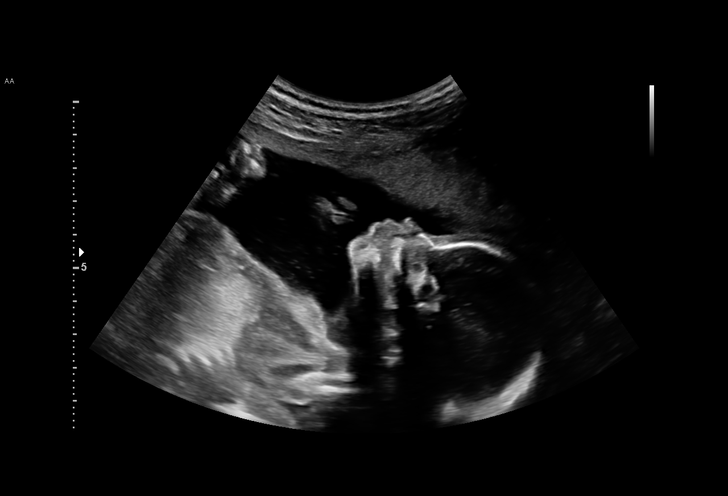
[im 75/81]
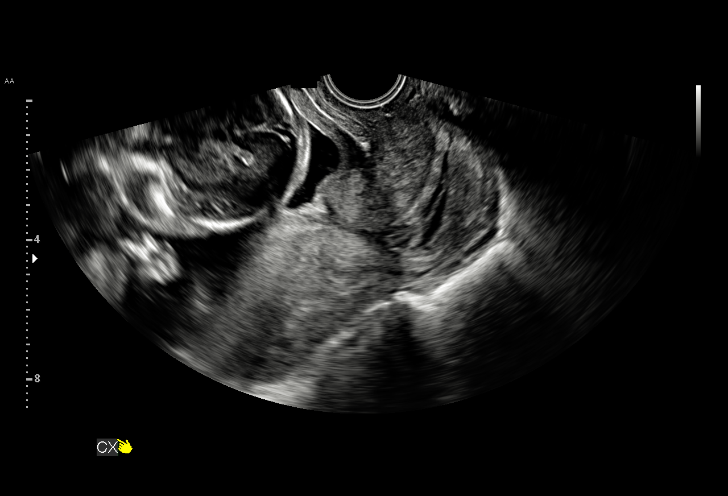
[im 81/81]
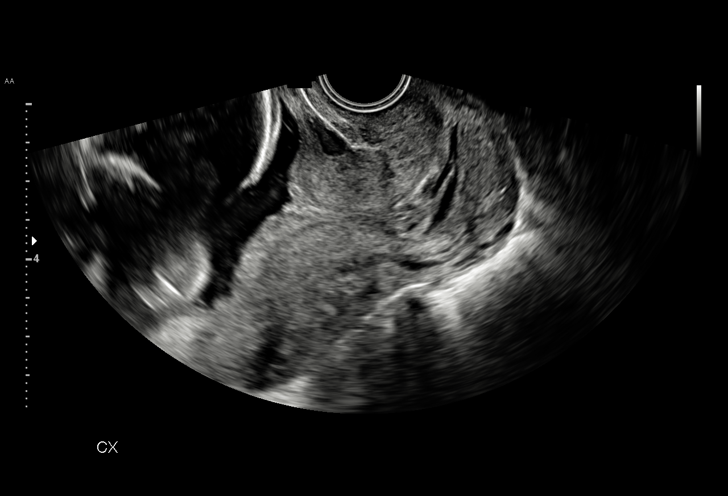

[14 of 28 positions shown; findings below may reference images not displayed]

SELAMAWIT

JUMPER

Indications

21 weeks gestation of pregnancy
Encounter for fetal anatomic survey
Encounter for cervical length
OB History

Gravidity:    2         Term:   1
Living:       1
Fetal Evaluation

Num Of Fetuses:     1
Fetal Heart         150
Rate(bpm):
Cardiac Activity:   Observed
Presentation:       Cephalic
Placenta:           Posterior, low-lying, 1.7 cm from int os
P. Cord Insertion:  Visualized

Amniotic Fluid
AFI FV:      Subjectively within normal limits

Largest Pocket(cm)
4.0
Biometry

BPD:      50.6  mm     G. Age:  21w 2d         50  %    CI:        74.41   %   70 - 86
FL/HC:      18.2   %   15.9 -
HC:      186.2  mm     G. Age:  20w 6d         26  %    HC/AC:      1.17       1.06 -
AC:      158.6  mm     G. Age:  21w 0d         34  %    FL/BPD:     66.8   %
FL:       33.8  mm     G. Age:  20w 4d         20  %    FL/AC:      21.3   %   20 - 24
CER:      22.6  mm     G. Age:  21w 2d         50  %
NFT:       2.8  mm
CM:        5.6  mm

Est. FW:     381  gm    0 lb 13 oz      34  %
Gestational Age

Clinical EDD:  21w 2d                                        EDD:   04/13/17
U/S Today:     21w 0d                                        EDD:   04/15/17
Best:          21w 2d    Det. By:   Clinical EDD             EDD:   04/13/17
Anatomy

Cranium:               Appears normal         Aortic Arch:            Appears normal
Cavum:                 Not well visualized    Ductal Arch:            Appears normal
Ventricles:            Appears normal         Diaphragm:              Appears normal
Choroid Plexus:        Appears normal         Stomach:                Appears normal, left
sided
Cerebellum:            Appears normal         Abdomen:                Appears normal
Posterior Fossa:       Appears normal         Abdominal Wall:         Appears nml (cord
insert, abd wall)
Nuchal Fold:           Appears normal         Cord Vessels:           Appears normal (3
vessel cord)
Face:                  Appears normal         Kidneys:                Bilat pyelectasis,
(orbits and profile)
Rt mm, Lt mm
Lips:                  Appears normal         Bladder:                Appears normal
Thoracic:              Appears normal         Spine:                  Appears normal
Heart:                 Appears normal         Upper Extremities:      Appears normal
(4CH, axis, and situs
RVOT:                  Appears normal         Lower Extremities:      Appears normal
LVOT:                  Appears normal

Other:  Fetus appears to be a male. Heels and 5th digit visualized.
Cervix Uterus Adnexa

Cervix
Length:           3.38  cm.
Measured transvaginally. Appears closed, without funnelling.
Impression

Singleton intrauterine pregnancy at 21+2 weeks, here for
anatomic survey
Review of the anatomy shows no significant sonographic
markers for aneuploidy or structural anomalies
There is borderline pyelectasis noted
Amniotic fluid volume is normal
Estimated fetal weight is 381g which is growth in the 34th
percentile
The placenta is low-lying
Recommendations

Recommend repeat scan in 6 weeks to assess placental
position and pyelectasis

## 2018-03-23 ENCOUNTER — Other Ambulatory Visit: Payer: Medicaid Other

## 2018-03-23 DIAGNOSIS — O034 Incomplete spontaneous abortion without complication: Secondary | ICD-10-CM

## 2018-03-24 LAB — BETA HCG QUANT (REF LAB): HCG QUANT: 88 m[IU]/mL

## 2018-04-04 NOTE — Progress Notes (Deleted)
   CC: ***  HPI  Birth control - last sexual activity ***. UPT ***.   ROS: ***Denies CP, SOB, abdominal pain, dysuria, changes in BMs.   CC, SH/smoking status, and VS noted  Objective: There were no vitals taken for this visit. Gen: NAD, alert, cooperative, and pleasant.*** HEENT: NCAT, EOMI, PERRL CV: RRR, no murmur Resp: CTAB, no wheezes, non-labored Abd: SNTND, BS present, no guarding or organomegaly Ext: No edema, warm Neuro: Alert and oriented, Speech clear, No gross deficits  Assessment and plan:  No problem-specific Assessment & Plan notes found for this encounter.   No orders of the defined types were placed in this encounter.   No orders of the defined types were placed in this encounter.   Health Maintenance reviewed - {health maintenance:315237}.  Loni Muse, MD, PGY2 04/04/2018 11:12 AM

## 2018-04-05 ENCOUNTER — Ambulatory Visit: Payer: Medicaid Other | Admitting: Family Medicine

## 2018-04-09 DIAGNOSIS — N3000 Acute cystitis without hematuria: Secondary | ICD-10-CM | POA: Diagnosis not present

## 2018-04-17 ENCOUNTER — Encounter: Payer: Self-pay | Admitting: Family Medicine

## 2018-04-17 ENCOUNTER — Other Ambulatory Visit: Payer: Self-pay

## 2018-04-17 ENCOUNTER — Ambulatory Visit (INDEPENDENT_AMBULATORY_CARE_PROVIDER_SITE_OTHER): Payer: Medicaid Other | Admitting: Family Medicine

## 2018-04-17 VITALS — BP 108/72 | HR 59 | Temp 98.9°F | Ht 66.0 in | Wt 117.8 lb

## 2018-04-17 DIAGNOSIS — Z3200 Encounter for pregnancy test, result unknown: Secondary | ICD-10-CM

## 2018-04-17 DIAGNOSIS — Z3201 Encounter for pregnancy test, result positive: Secondary | ICD-10-CM

## 2018-04-17 DIAGNOSIS — Z3009 Encounter for other general counseling and advice on contraception: Secondary | ICD-10-CM | POA: Diagnosis present

## 2018-04-17 LAB — POCT URINE PREGNANCY: PREG TEST UR: POSITIVE — AB

## 2018-04-17 NOTE — Patient Instructions (Signed)
It was a pleasure to see you today! Thank you for choosing [facility] for your primary care. Anne Vaughn was seen for birth control.   Our plans for today were:  I will call you today or tomorrow with results.   Best,  Dr. Chanetta Marshallimberlake

## 2018-04-17 NOTE — Progress Notes (Signed)
   CC: wants birth control  HPI  Birth control - see previous notes regarding TAB in early may. Went to MAU, had US with ?retained products, beta trended and going down. She is worried that no one called since the second beta lab was drawn, but she did see the result in mychart. UPT positive today. Has had sex once since cytotec, maybe mid may, with condom.  She would like a birth control that, "would put some weight on me, but her hairdresser told her that Depo was going to break her hair off and she is concerned about this." she does not want anything "in her body."  ROS: Denies CP, SOB, abdominal pain, dysuria, changes in BMs.   CC, SH/smoking status, and VS noted  Objective: BP 108/72   Pulse (!) 59   Temp 98.9 F (37.2 C) (Oral)   Ht 5\' 6"  (1.676 m)   Wt 117 lb 12.8 oz (53.4 kg)   SpO2 99%   Breastfeeding? No   BMI 19.01 kg/m  Gen: NAD, alert, cooperative, and pleasant. HEENT: NCAT, EOMI, PERRL CV: RRR, no murmur Resp: CTAB, no wheezes, non-labored Ext: No edema, warm Neuro: Alert and oriented, Speech clear, No gross deficits  Assessment and plan:  UPT positive: Unclear etiology for positive pregnancy test, discussed with Dr. Vergie LivingPickens OB on-call, will get beta today and based on this result structure future work-up.  If grossly elevated beta, would proceed with a repeat transvaginal ultrasound to assess any uterine contents.  If mildly elevated would trend another beta in 48 hours to assess downtrending or increasing.  If downtrending, may be able to give Depo at that point.  Orders Placed This Encounter  Procedures  . Beta hCG quant (ref lab)  . POCT urine pregnancy    No orders of the defined types were placed in this encounter.  Loni MuseKate Caliyah Sieh, MD, PGY2 04/17/2018 12:23 PM

## 2018-04-18 ENCOUNTER — Telehealth: Payer: Self-pay | Admitting: Family Medicine

## 2018-04-18 DIAGNOSIS — Z3200 Encounter for pregnancy test, result unknown: Secondary | ICD-10-CM

## 2018-04-18 LAB — BETA HCG QUANT (REF LAB): hCG Quant: 30 m[IU]/mL

## 2018-04-18 NOTE — Telephone Encounter (Signed)
Called patient to discuss lab results, no answer. I asked her to call back to ask for [place] hall and I will also send her a mychart message:   Your labs from yesterday were as we expected. The pregnancy hormone declined further to 30, which is good (suggests it is less likely that there is a new pregnancy). As we talked about yesterday, we need to get another blood lab tomorrow (6/12) to make sure it continues to decline, and then we can do depo. You don't need a doctor's appt, just come in and let them know that I placed a lab order for you.

## 2018-04-19 NOTE — Telephone Encounter (Signed)
LVM asking pt to give our office a call. Anne SpillersSharon T Oshea Percival, CMA

## 2018-04-21 ENCOUNTER — Other Ambulatory Visit: Payer: Medicaid Other

## 2018-04-24 NOTE — Telephone Encounter (Signed)
Attempted to call again.  No answer and no machine.   Dr. Chanetta Marshallimberlake do you want to mail a letter?Milas Gain. Sholonda Jobst, [name], CMA

## 2018-04-24 NOTE — Telephone Encounter (Signed)
That number works, we have messaged through [employer] too. No need to send letter. We will wait to hear back.

## 2018-05-01 ENCOUNTER — Other Ambulatory Visit: Payer: Medicaid Other

## 2018-05-03 ENCOUNTER — Other Ambulatory Visit: Payer: Medicaid Other

## 2018-05-03 DIAGNOSIS — Z3200 Encounter for pregnancy test, result unknown: Secondary | ICD-10-CM

## 2018-05-04 LAB — BETA HCG QUANT (REF LAB): hCG Quant: 5 m[IU]/mL

## 2018-05-30 ENCOUNTER — Encounter: Payer: Self-pay | Admitting: Family Medicine

## 2018-05-31 ENCOUNTER — Telehealth (HOSPITAL_BASED_OUTPATIENT_CLINIC_OR_DEPARTMENT_OTHER): Payer: Self-pay | Admitting: Family Medicine

## 2018-06-01 NOTE — Telephone Encounter (Signed)
Patient sent mychart message requesting depo appt ASAP, she prefers afternoon after 1. Could we call and schedule this?

## 2018-06-02 NOTE — Telephone Encounter (Signed)
Contacted pt and scheduled her an appointment with PCP on a same day visit next week. [name], Aylee Littrell D, [place]

## 2018-06-07 ENCOUNTER — Encounter: Payer: Self-pay | Admitting: Family Medicine

## 2018-06-07 ENCOUNTER — Other Ambulatory Visit: Payer: Self-pay

## 2018-06-07 ENCOUNTER — Ambulatory Visit: Payer: Medicaid Other | Admitting: Family Medicine

## 2018-06-07 VITALS — BP 116/68 | HR 63 | Temp 98.8°F | Ht 66.0 in | Wt 128.4 lb

## 2018-06-07 DIAGNOSIS — Z3009 Encounter for other general counseling and advice on contraception: Secondary | ICD-10-CM

## 2018-06-07 DIAGNOSIS — Z789 Other specified health status: Secondary | ICD-10-CM | POA: Diagnosis not present

## 2018-06-07 LAB — POCT URINE PREGNANCY: Preg Test, Ur: NEGATIVE

## 2018-06-07 MED ORDER — MEDROXYPROGESTERONE ACETATE 150 MG/ML IM SUSY
150.0000 mg | PREFILLED_SYRINGE | Freq: Once | INTRAMUSCULAR | Status: AC
  Administered 2018-06-07: 150 mg via INTRAMUSCULAR

## 2018-06-07 NOTE — Progress Notes (Signed)
   CC: Desires contraception  HPI  Desires contraception: Has been wanting Depakote for several months, has had trouble coming into scheduled appointments.  Previously with TAB with retained products causing elevated beta.  Repeat beta negative.  Patient LMP as below, has had unprotected sex with her partner since.  Continues to desire Deppe for birth control.  ROS: Denies CP, SOB, abdominal pain, dysuria, changes in BMs.   CC, SH/smoking status, and VS noted  Objective: BP 116/68   Pulse 63   Temp 98.8 F (37.1 C) (Oral)   Ht 5\' 6"  (1.676 m)   Wt 128 lb 6.4 oz (58.2 kg)   LMP 05/19/2018 (Approximate)   SpO2 99%   BMI 20.72 kg/m  Gen: NAD, alert, cooperative, and pleasant. HEENT: NCAT, EOMI, PERRL CV: RRR, no murmur Resp: CTAB, no wheezes, non-labored Abd: SNTND, BS present, no guarding or organomegaly Ext: No edema, warm Neuro: Alert and oriented, Speech clear, No gross deficits  Assessment and plan:  Encounter for birth control counseling: Patient with regular LMP as above, and did have unprotected sex since then, with UPT negative in clinic today.  Discussed that there is a very small risk she could be pregnant due to timing of recent intercourse, however O does not have any known mall effects for the fetus.  Will proceed with Depakote today and follow-up in 3 months, patient should alert us if her LMP does not occur as should.    Orders Placed This Encounter  Procedures  . POCT urine pregnancy    Meds ordered this encounter  Medications  . medroxyPROGESTERone Acetate SUSY 150 mg    Loni MuseKate Timberlake, MD, PGY3 06/09/2018 8:14 PM

## 2018-06-09 NOTE — Patient Instructions (Signed)
Patient declines AVS. 

## 2018-06-21 IMAGING — US US MFM OB FOLLOW-UP
1 series · 13 of 28 positions shown · non-contrast
Comparison: none

[Series 1: us mfm ob follow-up · 38 acquisitions, 13 frames shown]
[im 2/38]
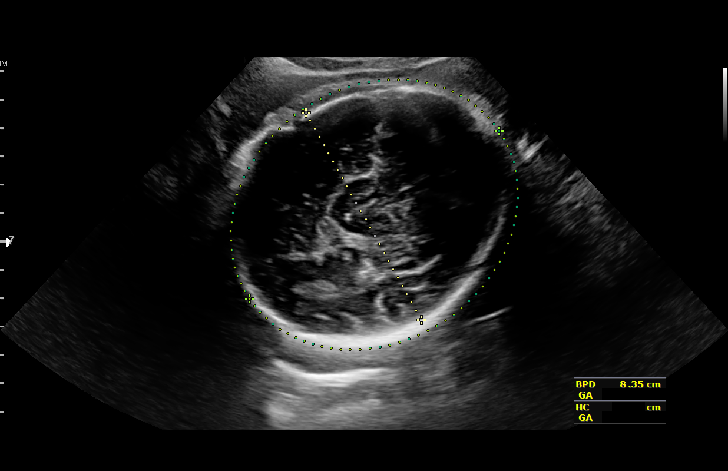
[im 5/38]
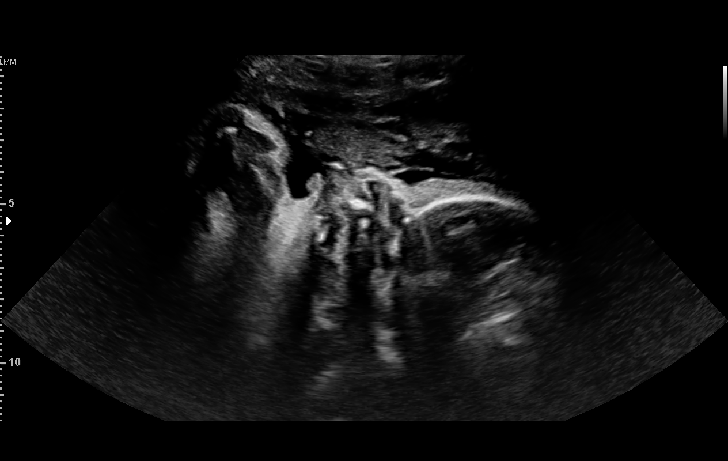
[im 7/38]
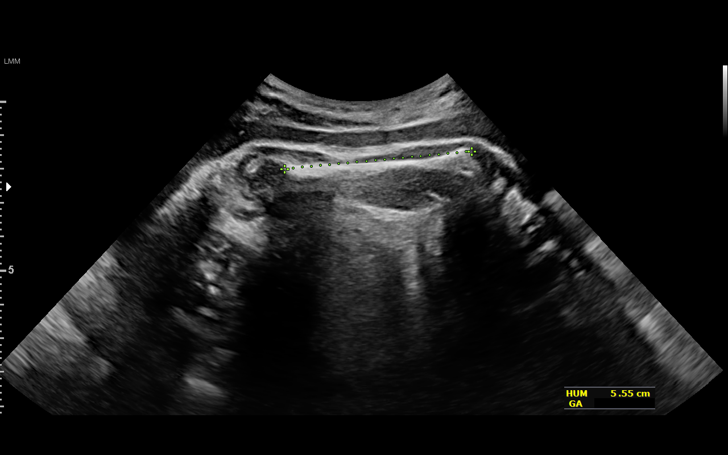
[im 10/38]
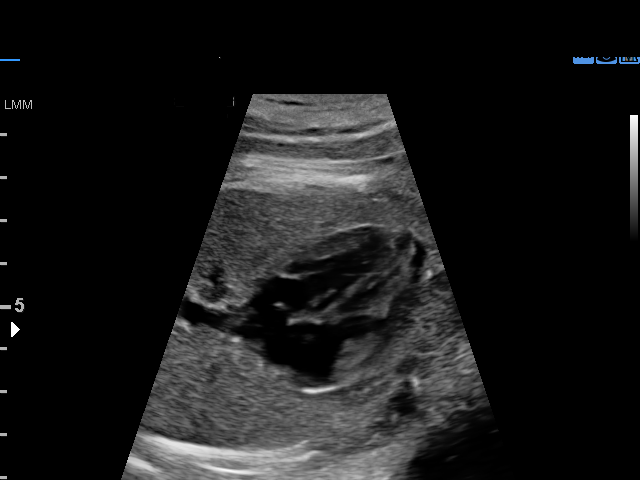
[im 13/38]
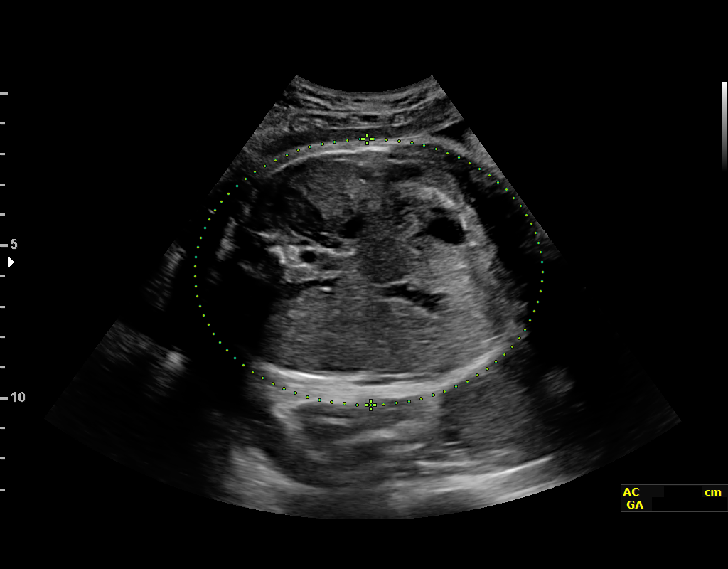
[im 16/38]
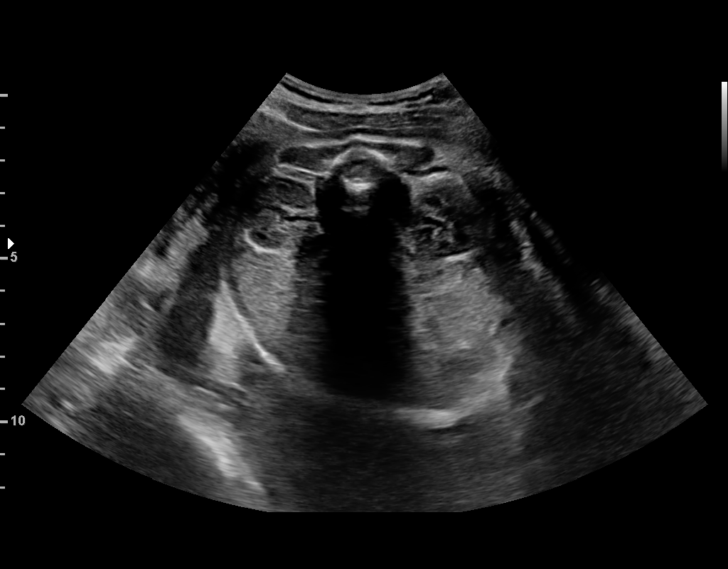
[im 20/38]
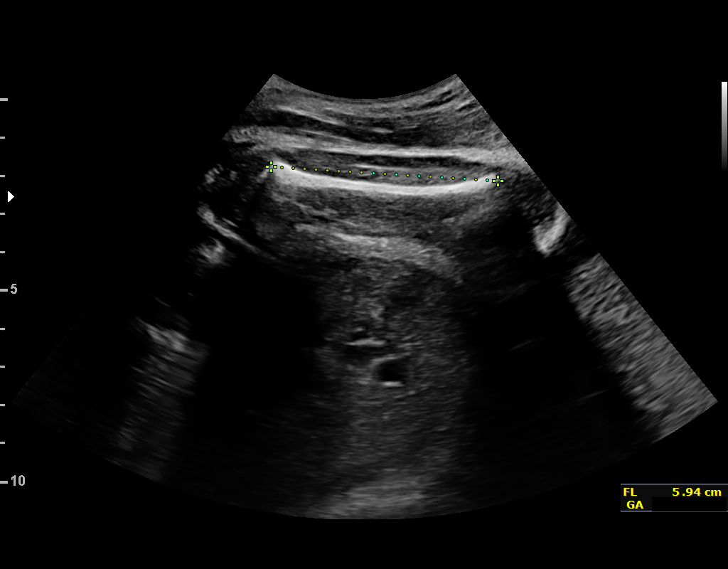
[im 22/38]
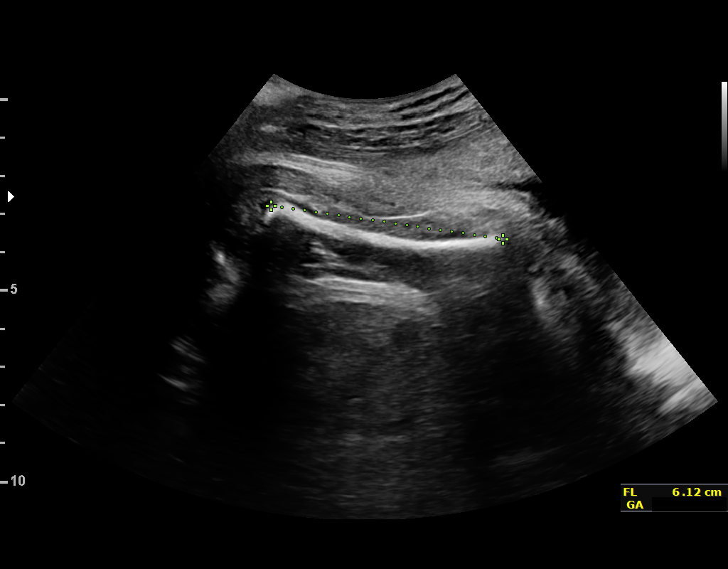
[im 25/38]
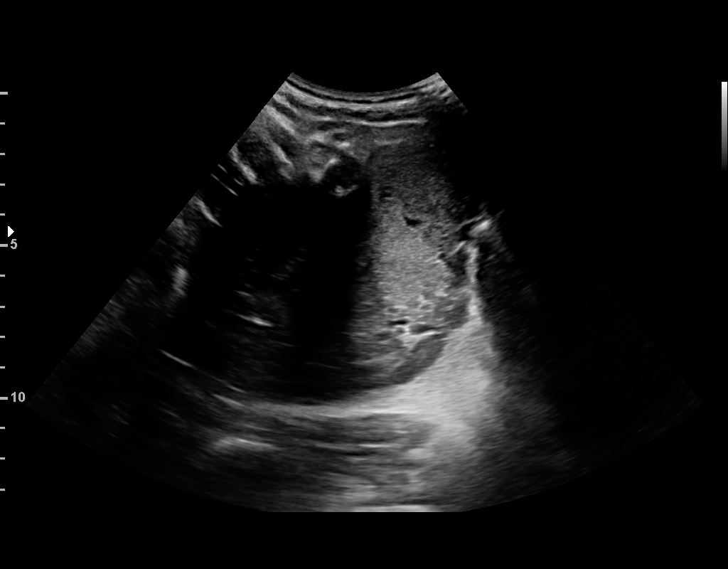
[im 28/38]
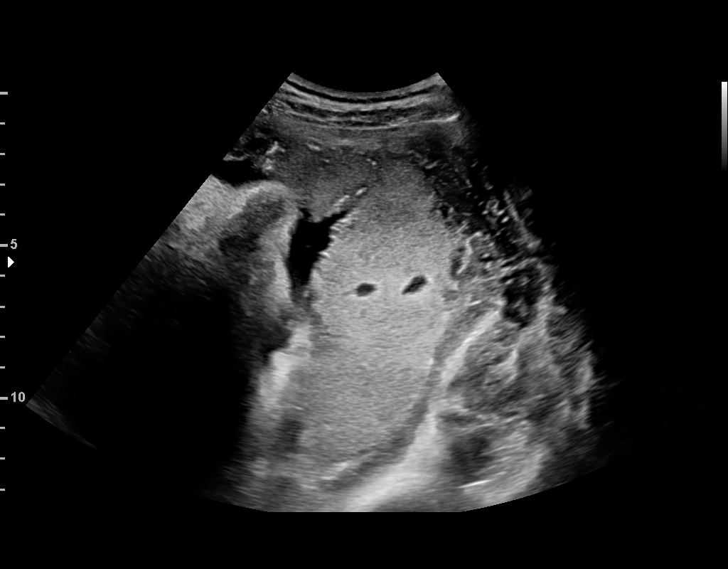
[im 31/38]
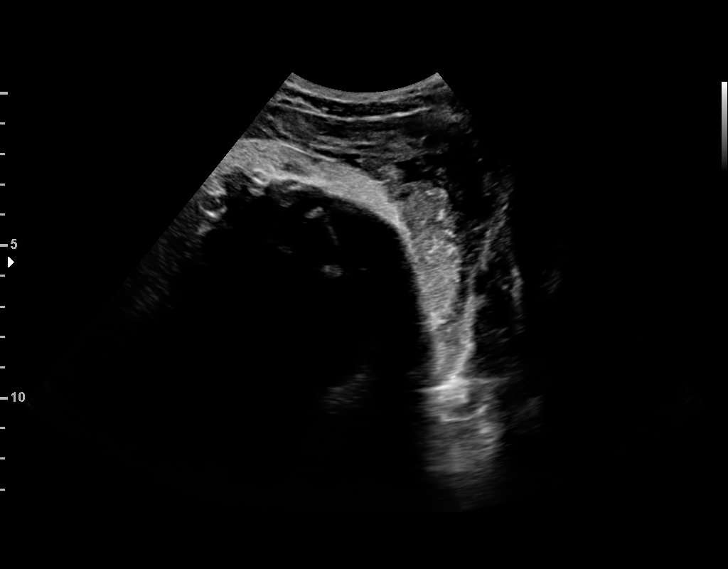
[im 33/38]
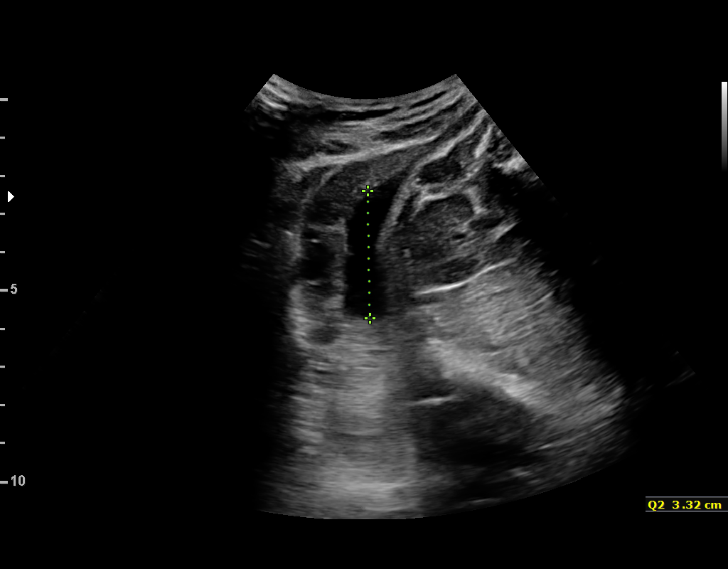
[im 36/38]
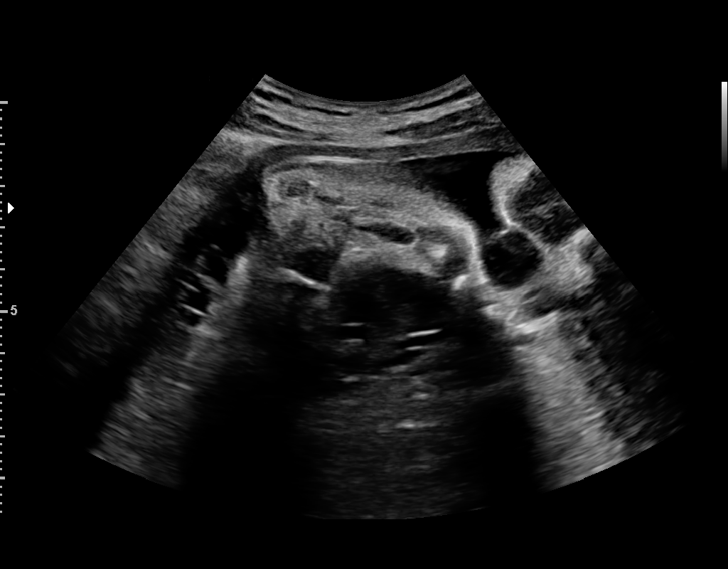

[13 of 28 positions shown; findings below may reference images not displayed]

SHYAM

1  VISHWANI LOUIS-JEAN           [id number]      [id number]     [id number]
Indications

34 weeks gestation of pregnancy
Poor weight gain of pregnancy, third trimester
OB History

Gravidity:    2         Term:   1
Living:       1
Fetal Evaluation

Num Of Fetuses:     1
Fetal Heart         154
Rate(bpm):
Cardiac Activity:   Observed
Presentation:       Cephalic
Placenta:           Left lateral, above cervical os
P. Cord Insertion:  Previously Visualized

Amniotic Fluid
AFI FV:      Subjectively low-normal

AFI Sum(cm)     %Tile       Largest Pocket(cm)
11.95           34

RUQ(cm)       RLQ(cm)       LUQ(cm)        LLQ(cm)
2.23
Biometry

BPD:      83.5  mm     G. Age:  33w 4d         29  %    CI:        74.82   %    70 - 86
FL/HC:      19.6   %    19.4 -
HC:      306.3  mm     G. Age:  34w 1d         15  %    HC/AC:      1.00        0.96 -
AC:      307.2  mm     G. Age:  34w 5d         65  %    FL/BPD:     72.0   %    71 - 87
FL:       60.1  mm     G. Age:  31w 2d        < 3  %    FL/AC:      19.6   %    20 - 24
HUM:      54.7  mm     G. Age:  31w 6d         10  %
Est. FW:    6667  gm    4 lb 15 oz      46  %
Gestational Age

U/S Today:     33w 3d                                        EDD:   04/19/17
Best:          34w 2d     Det. By:  Early Ultrasound         EDD:   04/13/17
(08/24/16)
Anatomy

Cranium:               Appears normal         Aortic Arch:            Previously seen
Cavum:                 Previously seen        Ductal Arch:            Previously seen
Ventricles:            Previously seen        Diaphragm:              Previously seen
Choroid Plexus:        Previously seen        Stomach:                Appears normal, left
sided
Cerebellum:            Previously seen        Abdomen:                Appears normal
Posterior Fossa:       Previously seen        Abdominal Wall:         Previously seen
Nuchal Fold:           Previously seen        Cord Vessels:           Previously seen
Face:                  Orbits and profile     Kidneys:                Appear normal
previously seen
Lips:                  Previously seen        Bladder:                Appears normal
Thoracic:              Appears normal         Spine:                  Previously seen
Heart:                 Appears normal         Upper Extremities:      Previously seen
(4CH, axis, and situs
RVOT:                  Previously seen        Lower Extremities:      Previously seen
LVOT:                  Previously seen

Other:  Heels and 5th digit previously seen. Nasal bone previously visualized.
Technically difficult due to fetal position.
Impression

Single IUP at 34w 2d
Normal interval anatomy
Fetal growth is appropriate (46th %tile)
The femurs measure < 3%tile, but appear normal
morphologically - likely constitutional
Left lateral placenta without previa
Normal amniotic fluid volume
Recommendations

Follow-up ultrasounds as clinically indicated.

## 2018-07-15 DIAGNOSIS — N390 Urinary tract infection, site not specified: Secondary | ICD-10-CM | POA: Diagnosis not present

## 2018-08-23 ENCOUNTER — Ambulatory Visit: Payer: Medicaid Other

## 2018-09-14 ENCOUNTER — Ambulatory Visit (INDEPENDENT_AMBULATORY_CARE_PROVIDER_SITE_OTHER): Payer: Medicaid Other

## 2018-09-14 DIAGNOSIS — Z3042 Encounter for surveillance of injectable contraceptive: Secondary | ICD-10-CM

## 2018-09-14 DIAGNOSIS — Z3202 Encounter for pregnancy test, result negative: Secondary | ICD-10-CM

## 2018-09-14 LAB — POCT URINE PREGNANCY: Preg Test, Ur: NEGATIVE

## 2018-09-14 MED ORDER — MEDROXYPROGESTERONE ACETATE 150 MG/ML IM SUSY
150.0000 mg | PREFILLED_SYRINGE | Freq: Once | INTRAMUSCULAR | Status: AC
Start: 2018-09-14 — End: 2018-09-14
  Administered 2018-09-14: 150 mg via INTRAMUSCULAR

## 2018-09-14 NOTE — Progress Notes (Signed)
   Patient in to nurse clinic not within dates for Depo-Provera. Urine pregnancy test negative. Last unprotected intercourse one week ago. Advised to come back in 1-2 weeks for repeat pregnancy test and to use condoms for next 7-10 days. Condoms given.  Depo-Provera given IM to LUOQ. Patient tolerated well. Next depo due 11/30/18 - 12/14/18. Reminder card given.  Ples Specter, RN [facility] RN)

## 2018-12-01 ENCOUNTER — Ambulatory Visit: Payer: Medicaid Other

## 2018-12-05 ENCOUNTER — Encounter: Payer: Self-pay | Admitting: Student in an Organized Health Care Education/Training Program

## 2018-12-05 ENCOUNTER — Other Ambulatory Visit (HOSPITAL_COMMUNITY)
Admission: RE | Admit: 2018-12-05 | Discharge: 2018-12-05 | Disposition: A | Discharge status: Home / Self Care | Payer: Medicaid Other | Source: Ambulatory Visit | Attending: Family Medicine | Admitting: Family Medicine

## 2018-12-05 ENCOUNTER — Ambulatory Visit (INDEPENDENT_AMBULATORY_CARE_PROVIDER_SITE_OTHER): Payer: Medicaid Other | Admitting: Student in an Organized Health Care Education/Training Program

## 2018-12-05 ENCOUNTER — Other Ambulatory Visit: Payer: Self-pay

## 2018-12-05 ENCOUNTER — Ambulatory Visit: Payer: Medicaid Other

## 2018-12-05 VITALS — BP 118/66 | HR 66 | Temp 98.7°F | Ht 66.0 in | Wt 117.6 lb

## 2018-12-05 DIAGNOSIS — Z202 Contact with and (suspected) exposure to infections with a predominantly sexual mode of transmission: Secondary | ICD-10-CM | POA: Diagnosis not present

## 2018-12-05 DIAGNOSIS — Z3042 Encounter for surveillance of injectable contraceptive: Secondary | ICD-10-CM

## 2018-12-05 DIAGNOSIS — Z3202 Encounter for pregnancy test, result negative: Secondary | ICD-10-CM

## 2018-12-05 LAB — POCT URINE PREGNANCY: Preg Test, Ur: NEGATIVE

## 2018-12-05 LAB — POCT WET PREP (WET MOUNT)
CLUE CELLS WET PREP WHIFF POC: NEGATIVE
Trichomonas Wet Prep HPF POC: ABSENT

## 2018-12-05 MED ORDER — MEDROXYPROGESTERONE ACETATE 150 MG/ML IM SUSP
150.0000 mg | Freq: Once | INTRAMUSCULAR | Status: AC
  Administered 2018-12-05: 150 mg via INTRAMUSCULAR

## 2018-12-05 NOTE — Progress Notes (Signed)
Opened in error

## 2018-12-05 NOTE — Progress Notes (Addendum)
   Subjective:     Anne Vaughn, is a 23 y.o. female   History provider by patient No interpreter necessary.  Chief Complaint  Patient presents with  . Contraception  . STD check    HPI:  Anne Vaughn is a 22yo F who presents for Depo shot and STI testing with recent symptoms of vaginal itching and discharge. She reports itching began 1.5 weeks ago, 2 days after her most recent sexual encounter with a female partner. She also reported feeling her bladder was "weak" as she felt she was almost unable to make it to the restroom in time. She reports no urinary urgency and feels she is able to fully empty her bladder. She states this feeling of "weak bladder" has improved over the past week.  About 2 days ago she noticed some white clumpy discharge and a slight change in smell. She states it was not "a foul smell", but different. She reports no dysuria, fevers, headaches, abdominal/chest/back pain and no new genital sores/lesions. No recent abx use.  ROS see HPI  Patient's history was reviewed and updated as appropriate: allergies, current medications, past family history, past medical history, past social history, past surgical history and problem list.     Objective:    BP 118/66   Pulse 66   Temp 98.7 F (37.1 C) (Oral)   Ht 5\' 6"  (1.676 m)   Wt 117 lb 9.6 oz (53.3 kg)   SpO2 98%   BMI 18.98 kg/m   Physical Exam Exam conducted with a chaperone present.  Cardiovascular:     Rate and Rhythm: Normal rate and regular rhythm.     Heart sounds: Normal heart sounds.  Pulmonary:     Effort: Pulmonary effort is normal.     Breath sounds: Normal breath sounds.  Genitourinary:    General: Normal vulva.     Labia:        Right: No lesion.        Left: No lesion.      Vagina: No vaginal discharge.     Cervix: No discharge.     Comments: Some bleeding at external cervical os      Assessment & Plan:   Anne Vaughn is a 23yo F who presents for Depo Shot and STI Screening.  While bladder symptoms could be cystitis, symptoms are improving and patient is more concerned about discharge and sexually transmitted infections. Will proceed with pelvic exam for STI testing with return precautions if bladder symptoms restart.   Depo Shot Negative pregnancy test -Given Depo-provera -schedule for 3 month f/u  STI Screening Negative Wet Prep for Trich, BV, Yeast, GC/Chlamydia -Bloodwork for HIV, RPR pending -Will update with results   Supportive care and return precautions reviewed.  No follow-ups on file.  Barbara Cower, Medical Student   I have independently seen and examined the patient. I have discussed the findings and exam with the medical student and agree with the above note. My physical exam is documented above.  Howard Pouch, MD PGY-3 [facility]

## 2018-12-05 NOTE — Patient Instructions (Addendum)
It was a pleasure seeing you today in our clinic.  Here is the treatment plan we have discussed and agreed upon together:   You do not have a yeast infection.  We drew blood work at today's visit. I will call or send you a letter with these results. If you do not hear from me within the next week, please give our office a call.  Our clinic's number is [phone]. Please call with questions or concerns about what we discussed today.  Be well, Dr. Mosetta Putt

## 2018-12-06 LAB — RPR: RPR Ser Ql: NONREACTIVE

## 2018-12-06 LAB — CERVICOVAGINAL ANCILLARY ONLY
CHLAMYDIA, DNA PROBE: NEGATIVE
Neisseria Gonorrhea: NEGATIVE

## 2018-12-06 LAB — HIV ANTIBODY (ROUTINE TESTING W REFLEX): HIV Screen 4th Generation wRfx: NONREACTIVE

## 2018-12-07 ENCOUNTER — Encounter: Payer: Self-pay | Admitting: Student in an Organized Health Care Education/Training Program

## 2019-01-29 ENCOUNTER — Ambulatory Visit (INDEPENDENT_AMBULATORY_CARE_PROVIDER_SITE_OTHER): Payer: Medicaid Other | Admitting: Family Medicine

## 2019-01-29 ENCOUNTER — Other Ambulatory Visit: Payer: Self-pay

## 2019-01-29 VITALS — BP 119/70 | HR 72 | Temp 98.6°F | Wt 123.8 lb

## 2019-01-29 DIAGNOSIS — R5383 Other fatigue: Secondary | ICD-10-CM | POA: Diagnosis not present

## 2019-01-29 LAB — POCT HEMOGLOBIN: Hemoglobin: 12.5 g/dL (ref 11–14.6)

## 2019-01-29 MED ORDER — NORGESTIM-ETH ESTRAD TRIPHASIC 0.18/0.215/0.25 MG-25 MCG PO TABS: 1.0000 | ORAL_TABLET | Freq: Every day | ORAL | 11 refills | Status: DC

## 2019-01-29 NOTE — Patient Instructions (Signed)
Your  Hemoglobin number looks good. We could check your thyroid next time if you still feel off. I sent the birth control pills. You don't need to get the depo shot. Set an alarm in your phone to remind me you to take the pills. Use condoms for this month.

## 2019-01-29 NOTE — Progress Notes (Signed)
   CC: mood swings/fatgiue   HPI  Mood swings - felt like this was worse after depo, wants to change to pills and see how that goes. Also feels tired. Has been getting depo since this summer. Mood swings feel worse the first few weeks after the depo. Her kids asked what was wrong with mommy. More tearful. She feels her energy is better. She is tired. She has some breakouts now. Period stopped completely after depo started, she has some spotting that comes every month as she gets closer to the time when her depo is due.  She was concerned because she has some distant relatives with bipolar.  ROS: Denies CP, SOB, abdominal pain, dysuria, changes in BMs.   CC, SH/smoking status, and VS noted  Objective: BP 119/70   Pulse 72   Temp 98.6 F (37 C) (Oral)   Wt 123 lb 12.8 oz (56.2 kg)   SpO2 99%   BMI 19.98 kg/m  Gen: NAD, alert, cooperative, and pleasant. HEENT: NCAT, EOMI, PERRL CV: RRR, no murmur Resp: CTAB, no wheezes, non-labored Ext: No edema, warm Neuro: Alert and oriented, Speech clear, No gross deficits  Assessment and plan:    Office Visit from 01/29/2019 in [facility]  PHQ-9 Total Score  9     GAD 7 : Generalized Anxiety Score 01/29/2019  Nervous, Anxious, on Edge 1  Control/stop worrying 1  Worry too much - different things 1  Trouble relaxing 0  Restless 0  Easily annoyed or irritable 1  Afraid - awful might happen 0  Total GAD 7 Score 4  Anxiety Difficulty Somewhat difficult     Mood swings: Patient feels seizures are related to her Depo.  PHQ 9 and gad 7 not overwhelmingly high.  MDQ screen negative.  She would like to stop the Depakote, which is reasonable.  She is very motivated to take OCPs, as she had an unintended pregnancy and D&C last year about this time.  We will continue to monitor the use, and she will let me know if she feels like they improve off of the Depo.  Birth control: Patient would like to change from Depo-Provera to  OCPs.  She does not want an IUD or Nexplanon.  Prescribed OCPs and asked patient to use backup contraception.  She was within her Depo-Provera window.  Fatigue: Patient also relates this to her Depo-Provera use.  Offered CBC, TSH to assess for this.  She declined these labs today.  She will call me if this improves or if she has persistent concerns.  She does have a history of iron deficiency anemia with her pregnancies, but fingerstick hemoglobin is normal at 12.5 today.  No problem-specific Assessment & Plan notes found for this encounter.   Orders Placed This Encounter  Procedures  . Hemoglobin    Meds ordered this encounter  Medications  . Norgestimate-Ethinyl Estradiol Triphasic 0.18/0.215/0.25 MG-25 MCG tab    Sig: Take 1 tablet by mouth daily.    Dispense:  1 Package    Refill:  11   Loni Muse, MD, PGY3 01/29/2019 4:20 PM

## 2019-01-29 NOTE — Progress Notes (Signed)
.  hem

## 2019-07-01 IMAGING — US US OB TRANSVAGINAL
1 series · 15 of 28 positions shown · non-contrast
Comparison: None relevant

CLINICAL DATA: 21-year-old female with vaginal bleeding in the 1st
trimester of pregnancy.

Quantitative beta HCG has reportedly fallen from 580 on 03/10/18 to
318 today.
Estimated gestational age by LMP 9 weeks 4 days.  LMP 01/06/2018.
EXAM:
OBSTETRIC <14 WK US AND TRANSVAGINAL OB US
TECHNIQUE: Both transabdominal and transvaginal ultrasound examinations were
performed for complete evaluation of the gestation as well as the
maternal uterus, adnexal regions, and pelvic cul-de-sac.
Transvaginal technique was performed to assess early pregnancy.

[Series 1: us ob transvaginal · 15 of 49 slices shown]
[im 1/49]
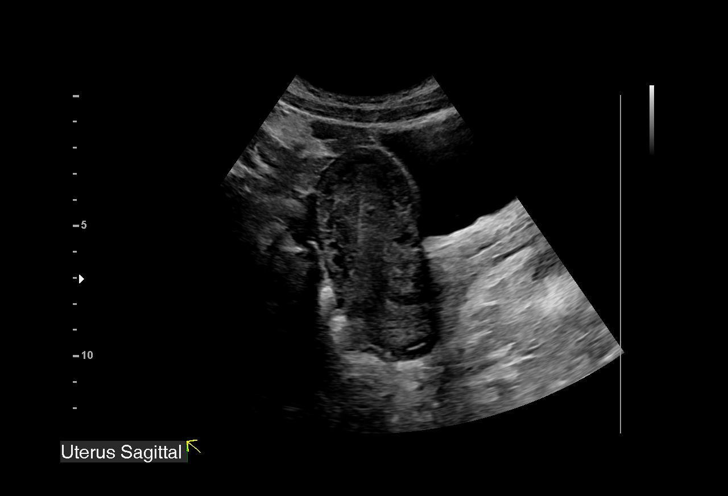
[im 4/49]
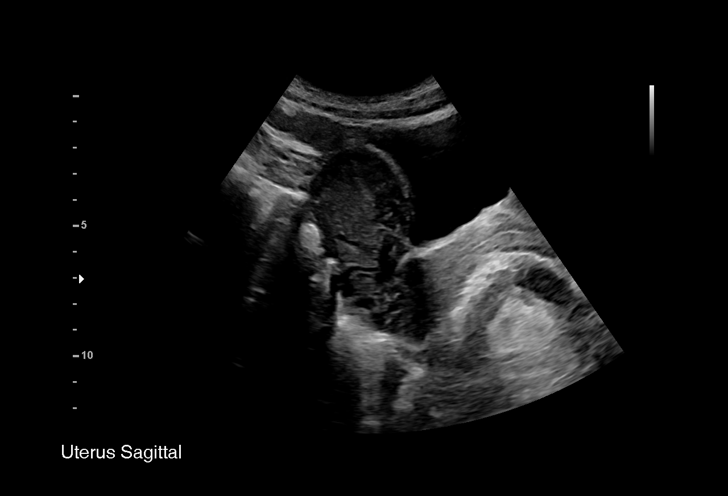
[im 8/49]
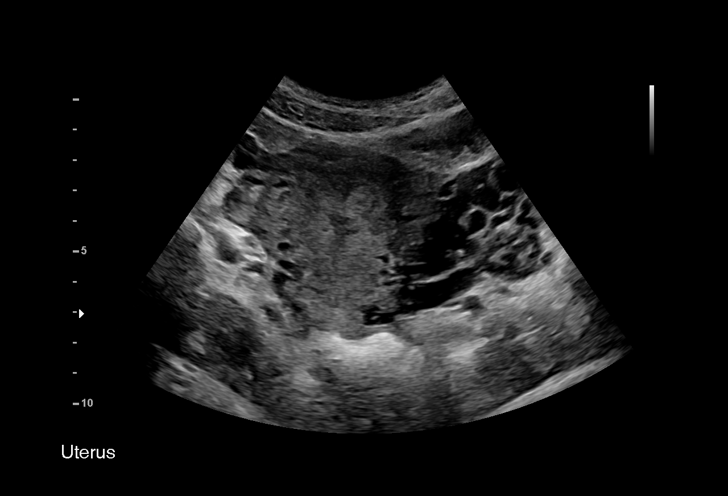
[im 11/49]
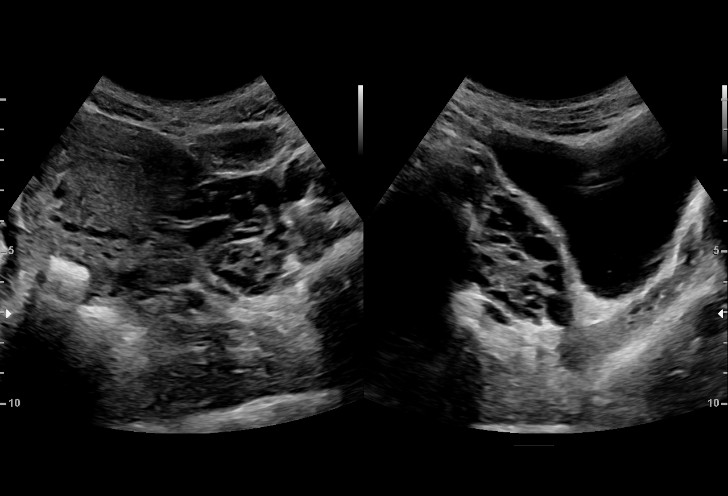
[im 15/49]
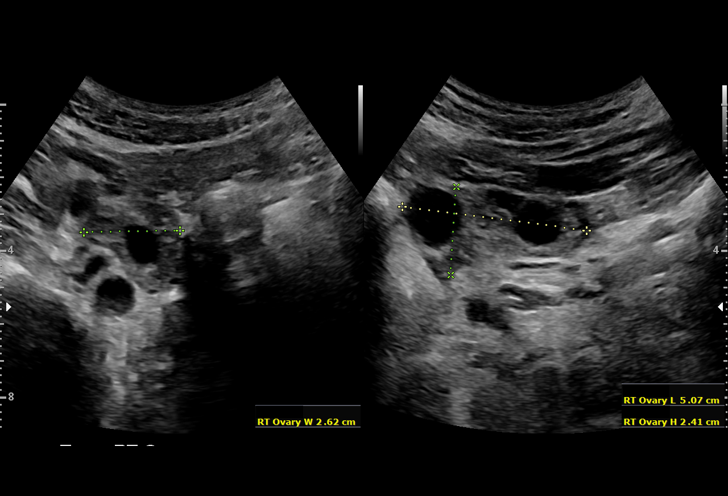
[im 18/49]
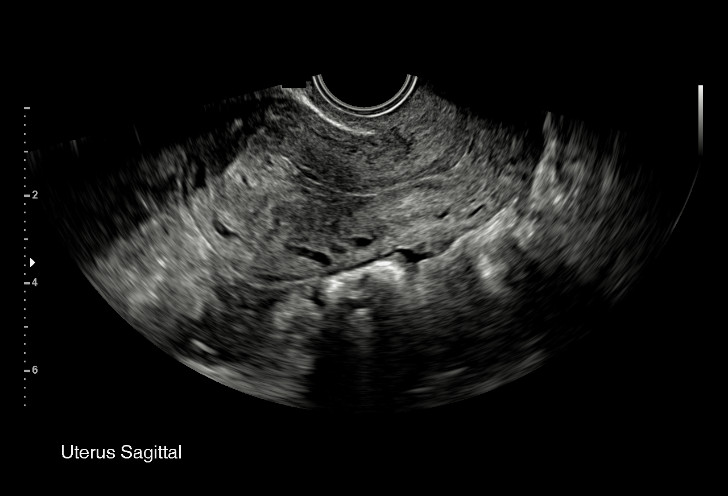
[im 22/49]
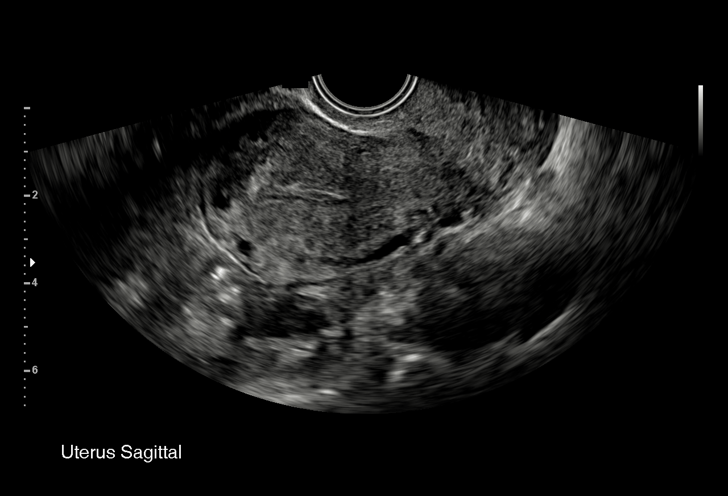
[im 25/49]
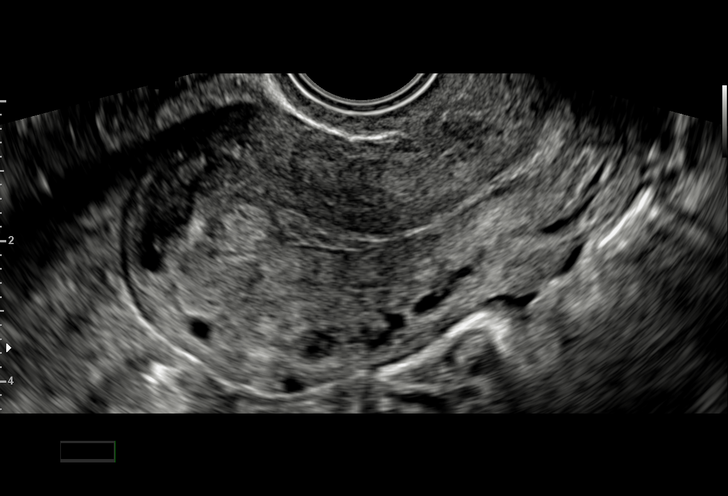
[im 27/49]
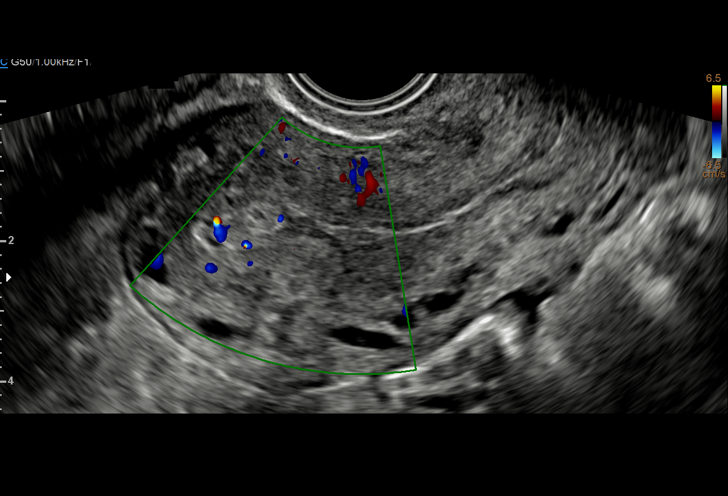
[im 31/49]
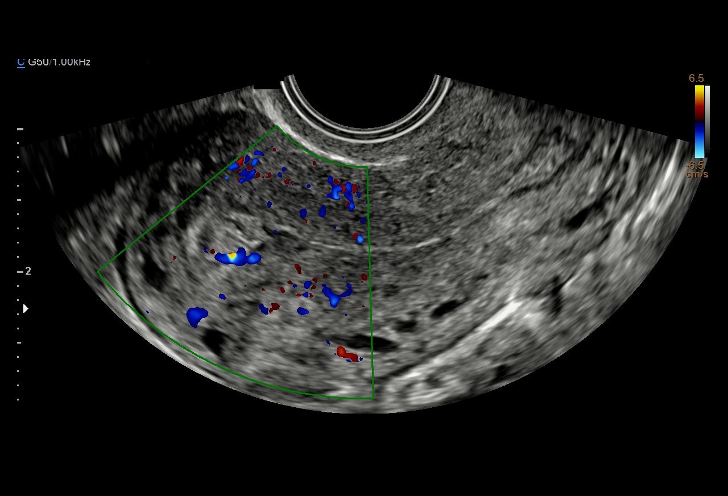
[im 34/49]
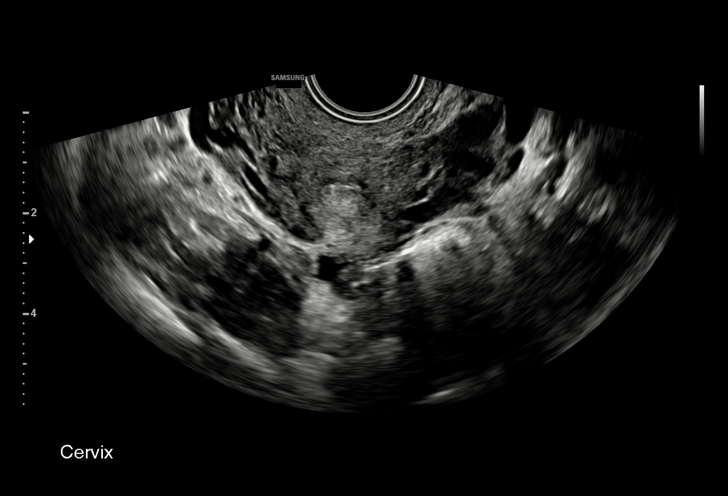
[im 38/49]
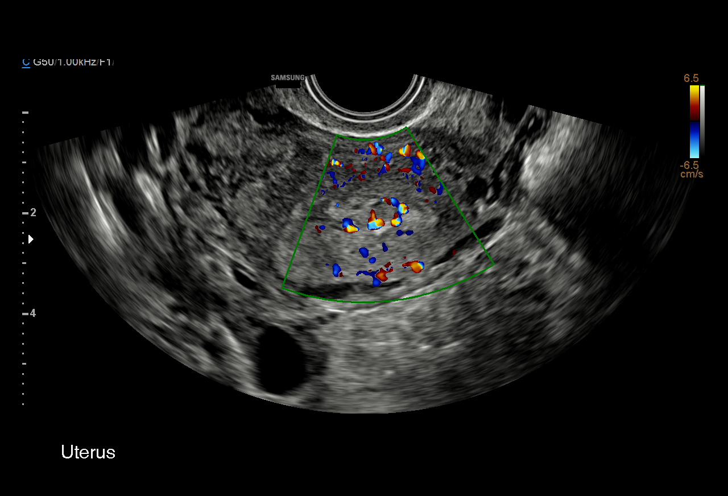
[im 41/49]
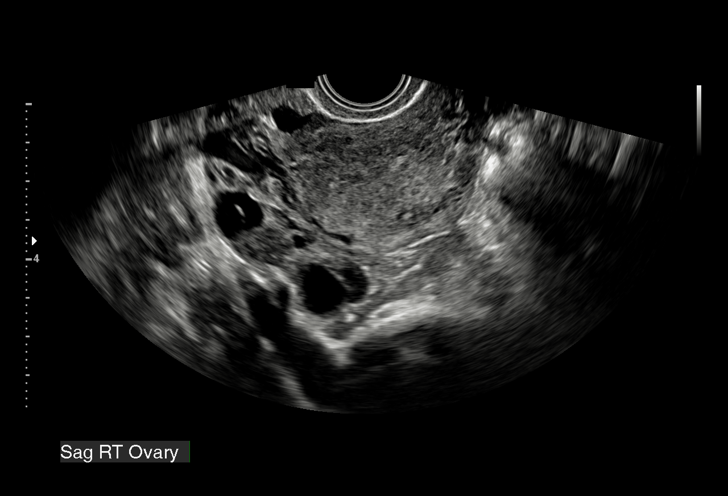
[im 45/49]
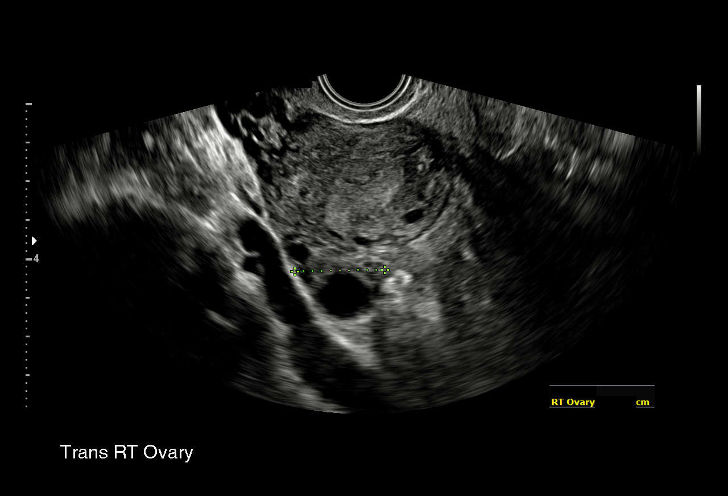
[im 49/49]
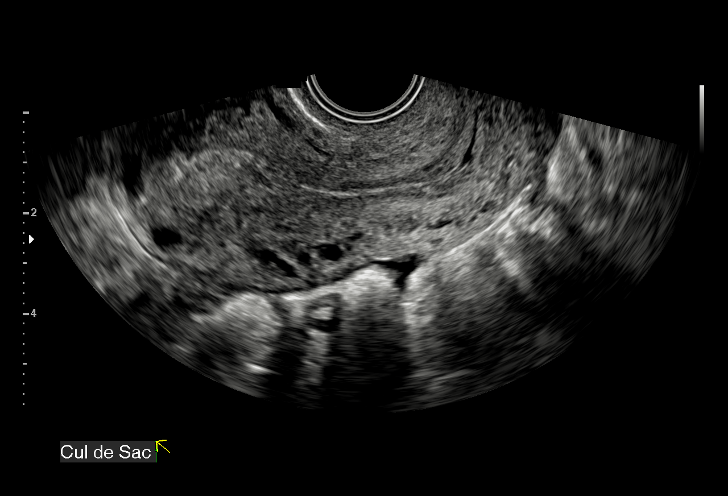

[15 of 28 positions shown; findings below may reference images not displayed]

FINDINGS: Intrauterine gestational sac: None

Yolk sac:  None

Embryo:  None

Cardiac Activity: Not applicable

Subchorionic hemorrhage:  None

Maternal uterus/adnexae: The left ovary appears normal measuring
x 3.7 x 2.1 centimeters.

The right ovary is larger measuring 5.1 x 2.4 x 2.6 centimeters but
appears within normal limits; there are several simple appearing
cysts. No adnexal mass or abnormality identified.

There is heterogeneous endometrial thickening at the uterine fundus
up to 9 millimeters with conspicuous hypervascularity (images
29-31).

No pelvic free fluid.
IMPRESSION: 1. No intrauterine gestational sac identified. No adnexal
abnormality identified. No pelvic free fluid.
2. Findings are suspicious but not yet definitive for failed
pregnancy, and heterogeneous endometrial thickening in the uterine
fundus may reflect products of conception.
3. Recommend follow-up US in 10-14 days for definitive diagnosis.
This recommendation follows SRU consensus guidelines: Diagnostic
Criteria for Nonviable Pregnancy Early in the First Trimester. N
Engl J Med 3841; [DATE].

## 2019-08-13 ENCOUNTER — Ambulatory Visit: Payer: Medicaid Other

## 2019-11-23 ENCOUNTER — Encounter: Payer: Self-pay | Admitting: Family Medicine

## 2019-11-23 ENCOUNTER — Ambulatory Visit (INDEPENDENT_AMBULATORY_CARE_PROVIDER_SITE_OTHER): Payer: Medicaid Other | Admitting: Family Medicine

## 2019-11-23 ENCOUNTER — Other Ambulatory Visit (HOSPITAL_COMMUNITY)
Admission: RE | Admit: 2019-11-23 | Discharge: 2019-11-23 | Disposition: A | Discharge status: Home / Self Care | Payer: Medicaid Other | Source: Ambulatory Visit | Attending: Family Medicine | Admitting: Family Medicine

## 2019-11-23 ENCOUNTER — Other Ambulatory Visit: Payer: Self-pay

## 2019-11-23 VITALS — BP 118/56 | HR 81 | Wt 132.0 lb

## 2019-11-23 DIAGNOSIS — Z113 Encounter for screening for infections with a predominantly sexual mode of transmission: Secondary | ICD-10-CM | POA: Diagnosis not present

## 2019-11-23 DIAGNOSIS — B354 Tinea corporis: Secondary | ICD-10-CM

## 2019-11-23 DIAGNOSIS — Z3009 Encounter for other general counseling and advice on contraception: Secondary | ICD-10-CM

## 2019-11-23 DIAGNOSIS — Z124 Encounter for screening for malignant neoplasm of cervix: Secondary | ICD-10-CM | POA: Diagnosis not present

## 2019-11-23 DIAGNOSIS — N760 Acute vaginitis: Secondary | ICD-10-CM | POA: Diagnosis not present

## 2019-11-23 DIAGNOSIS — B9689 Other specified bacterial agents as the cause of diseases classified elsewhere: Secondary | ICD-10-CM

## 2019-11-23 LAB — POCT WET PREP (WET MOUNT)
Clue Cells Wet Prep Whiff POC: POSITIVE
Trichomonas Wet Prep HPF POC: ABSENT

## 2019-11-23 MED ORDER — METRONIDAZOLE 500 MG PO TABS: 500.0000 mg | ORAL_TABLET | Freq: Two times a day (BID) | ORAL | 0 refills | Status: DC

## 2019-11-23 MED ORDER — CLOTRIMAZOLE 1 % EX CREA: 1.0000 "application " | TOPICAL_CREAM | Freq: Two times a day (BID) | CUTANEOUS | 0 refills | Status: DC

## 2019-11-23 NOTE — Patient Instructions (Addendum)
It was nice to see you today,  I think the rash on your stomach is a fungal infection called tinea versicolor.  I have prescribed a antifungal cream that you can put on twice a day for 4 weeks.  If the rash does not improve after then, let us know and we can try a different treatment.  Your wet prep indicates bacterial vaginosis which is consistent with your symptoms of odor and grayish discharge.  I have prescribed you a medication called metronidazole, which you should take twice a day for 7 days.  Please not drink alcohol while using this medication.  I will let you know of the other results of your STD screens when I get them.  Follow-up as needed.  Have a great day,  Frederic Jericho, MD

## 2019-11-23 NOTE — Progress Notes (Signed)
   [facility] Phone: [phone]     Anne Vaughn - 24 y.o. female MRN [id number]  Date of birth: 1996/01/12  Subjective:   cc: STD, Pap  HPI:  STD check:   Some cloudy discharge.  Has been with same partner.    Notices odor with intercourse.  Some odor with urine.  No increased frequency.  No abdominal pain, no fevers  Contraception:Does not use condoms.  Just got off depo shot 6 months ago due to side effects including AUB, mood swings, headaches.. Had same effects in 2013 and 2015 when she use the Depo shot.  Has also tried patch and pill.  Couldn't keep up with pills.  Doesn't want to get nexplenon because she thinks it could get lost on her arm.  Abdominal rash: Has noticed for 1 year.  Has not gone away during that time.  Pruritic. No hx of allergies or eczema.    ROS: See HPI for pertinent positives and negatives  Family history reviewed for today's visit. No changes.  -  reports that she has never smoked. She has never used smokeless tobacco.  Objective:   BP (!) 118/56   Pulse 81   Wt 132 lb (59.9 kg)   LMP 10/30/2019 (Exact Date)   SpO2 98%   BMI 21.31 kg/m  Gen: Alert and oriented.  No acute distress.  Appears stated age. GU: Normal-appearing female genitalia.  Cervix posterior and pointed to the right.  No discharge noted.  Some mild redness around os. Skin: Multiple annular, slightly raised, hyperpigmented lesions on the abdomen diffusely.  See picture below.      Assessment/Plan:   Bacterial vaginosis Complaints of cloudy discharge and odor.  No discharge seen on exam.  Wet prep shows clue cells. -Metronidazole 7-day course -Follow-up rest of STD check -Follow-up Pap  Tinea corporis Pruritic abdominal rash for approximately 1 year.  On exam there were multiple annular slightly raised erythematous lesions (see picture above).  Most likely consistent with tinea corporis, differential includes tinea versicolor, pityriasis  rosea.  Less likely eczema. -Clotrimazole cream twice daily  Birth control counseling Last had Depo shot 6 months ago.  Currently having unprotected sex with same partner in monogamous relationship.  Had multiple side effects with Depo shot.  Also tried patch which caused side effects and tried OCPs, which she would forget to take.  Patient is open to starting birth control and the risk and benefits of the different kinds of birth control methods were discussed.  Recommended either ParaGard, Nexplanon, or IUD.  Patient would like to think about it before making decision.     Frederic Jericho, MD PGY-2 [facility]

## 2019-11-24 LAB — RPR: RPR Ser Ql: NONREACTIVE

## 2019-11-24 LAB — HIV ANTIBODY (ROUTINE TESTING W REFLEX): HIV Screen 4th Generation wRfx: NONREACTIVE

## 2019-11-25 DIAGNOSIS — N760 Acute vaginitis: Secondary | ICD-10-CM | POA: Insufficient documentation

## 2019-11-25 DIAGNOSIS — B354 Tinea corporis: Secondary | ICD-10-CM | POA: Insufficient documentation

## 2019-11-25 DIAGNOSIS — B9689 Other specified bacterial agents as the cause of diseases classified elsewhere: Secondary | ICD-10-CM | POA: Insufficient documentation

## 2019-11-25 NOTE — Assessment & Plan Note (Signed)
Pruritic abdominal rash for approximately 1 year.  On exam there were multiple annular slightly raised erythematous lesions (see picture above).  Most likely consistent with tinea corporis, differential includes tinea versicolor, pityriasis rosea.  Less likely eczema. -Clotrimazole cream twice daily

## 2019-11-25 NOTE — Assessment & Plan Note (Signed)
Last had Depo shot 6 months ago.  Currently having unprotected sex with same partner in monogamous relationship.  Had multiple side effects with Depo shot.  Also tried patch which caused side effects and tried OCPs, which she would forget to take.  Patient is open to starting birth control and the risk and benefits of the different kinds of birth control methods were discussed.  Recommended either ParaGard, Nexplanon, or IUD.  Patient would like to think about it before making decision.

## 2019-11-25 NOTE — Assessment & Plan Note (Signed)
Complaints of cloudy discharge and odor.  No discharge seen on exam.  Wet prep shows clue cells. -Metronidazole 7-day course -Follow-up rest of STD check -Follow-up Pap

## 2019-11-26 LAB — CYTOLOGY - PAP
Chlamydia: NEGATIVE
Comment: NEGATIVE
Comment: NORMAL
Diagnosis: NEGATIVE
Neisseria Gonorrhea: NEGATIVE

## 2019-11-27 ENCOUNTER — Telehealth: Payer: Self-pay | Admitting: Family Medicine

## 2019-11-27 NOTE — Telephone Encounter (Signed)
LVM for pt informing her the remaining lab results were normal.

## 2020-04-01 ENCOUNTER — Ambulatory Visit: Payer: Medicaid Other | Admitting: Family Medicine

## 2020-04-01 NOTE — Progress Notes (Deleted)
    SUBJECTIVE:   CHIEF COMPLAINT / HPI:   abd rash:   Contraception:   PERTINENT  PMH / PSH: ***  OBJECTIVE:   There were no vitals taken for this visit.  ***  ASSESSMENT/PLAN:   No problem-specific Assessment & Plan notes found for this encounter.     Sandre Kitty, MD [facility]

## 2020-08-01 ENCOUNTER — Ambulatory Visit: Payer: Medicaid Other | Admitting: Family Medicine

## 2020-08-06 ENCOUNTER — Encounter: Payer: Self-pay | Admitting: Family Medicine

## 2020-08-06 ENCOUNTER — Other Ambulatory Visit: Payer: Self-pay

## 2020-08-06 ENCOUNTER — Ambulatory Visit (INDEPENDENT_AMBULATORY_CARE_PROVIDER_SITE_OTHER): Payer: Medicaid Other | Admitting: Family Medicine

## 2020-08-06 VITALS — BP 105/80 | HR 87 | Ht 67.32 in | Wt 132.0 lb

## 2020-08-06 DIAGNOSIS — Z30016 Encounter for initial prescription of transdermal patch hormonal contraceptive device: Secondary | ICD-10-CM

## 2020-08-06 MED ORDER — NORELGESTROMIN-ETH ESTRADIOL 150-35 MCG/24HR TD PTWK: 1.0000 | MEDICATED_PATCH | TRANSDERMAL | 12 refills | Status: DC

## 2020-08-06 NOTE — Patient Instructions (Signed)
It was wonderful to see you today.  Today we talked about:  Starting the patch for birth control. You can start this today. Please read below for description.   Please call the clinic at [phone] if you have any concerns. It was our pleasure to serve you.  Dr. Salvadore Dom   Ethinyl Estradiol; Norelgestromin skin patches What is this medicine? ETHINYL ESTRADIOL;NORELGESTROMIN (ETH in il es tra DYE ole; nor el JES troe min) skin patch is used as a contraceptive (birth control method). This medicine combines two types of female hormones, an estrogen and a progestin. This patch is used to prevent ovulation and pregnancy. This medicine may be used for other purposes; ask your health care provider or pharmacist if you have questions. COMMON BRAND NAME(S): Ortho Christianne Borrow What should I tell my health care provider before I take this medicine? They need to know if you have or ever had any of these conditions:  abnormal vaginal bleeding  blood vessel disease or blood clots  breast, cervical, endometrial, ovarian, liver, or uterine cancer  diabetes  gallbladder disease  having surgery  heart disease or recent heart attack  high blood pressure  high cholesterol or triglycerides  history of irregular heartbeat or heart valve problems  kidney disease  liver disease  migraine headaches  protein C deficiency  protein S deficiency  recently had a baby, miscarriage, or abortion  stroke  systemic lupus erythematosus (SLE)  tobacco smoker  an unusual or allergic reaction to estrogens, progestins, other medicines, foods, dyes, or preservatives  pregnant or trying to get pregnant  breast-feeding How should I use this medicine? This patch is applied to the skin. Follow the directions on the prescription label. Apply to clean, dry, healthy skin on the buttock, abdomen, upper outer arm or upper torso, in a place where it will not be rubbed by tight clothing. Do not use  lotions or other cosmetics on the site where the patch will go. Press the patch firmly in place for 10 seconds to ensure good contact with the skin. Change the patch every 7 days on the same day of the week for 3 weeks. You will then have a break from the patch for 1 week, after which you will apply a new patch. Do not use your medicine more often than directed. Contact your pediatrician regarding the use of this medicine in children. Special care may be needed. This medicine has been used in female children who have started having menstrual periods. A patient package insert for the product will be given with each prescription and refill. Read this sheet carefully each time. The sheet may change frequently. Overdosage: If you think you have taken too much of this medicine contact a poison control center or emergency room at once. NOTE: This medicine is only for you. Do not share this medicine with others. What if I miss a dose? You will need to replace your patch once a week as directed. If your patch is lost or falls off, contact your health care professional for advice. You may need to use another form of birth control if your patch has been off for more than 1 day. What may interact with this medicine? Do not take this medicine with the following medications:  dasabuvir; ombitasvir; paritaprevir; ritonavir  ombitasvir; paritaprevir; ritonavir This medicine may also interact with the following medications:  acetaminophen  antibiotics or medicines for infections, especially rifampin, rifabutin, rifapentine, and possibly penicillins or tetracyclines  aprepitant or fosaprepitant  armodafinil  ascorbic acid (vitamin C)  barbiturate medicines, such as phenobarbital or primidone  bosentan  certain antiviral medicines for hepatitis, HIV or AIDS  certain medicines for cancer treatment  certain medicines for seizures like carbamazepine, clobazam, felbamate, lamotrigine, oxcarbazepine,  phenytoin, rufinamide, topiramate  certain medicines for treating high cholesterol  cyclosporine  dantrolene  elagolix  flibanserin  grapefruit juice  lesinurad  medicines for diabetes  medicines to treat fungal infections, such as griseofulvin, miconazole, fluconazole, ketoconazole, itraconazole, posaconazole or voriconazole  mifepristone  mitotane  modafinil  morphine  mycophenolate  St. John's wort  tamoxifen  temazepam  theophylline or aminophylline  thyroid hormones  tizanidine  tranexamic acid  ulipristal  warfarin This list may not describe all possible interactions. Give your health care provider a list of all the medicines, herbs, non-prescription drugs, or dietary supplements you use. Also tell them if you smoke, drink alcohol, or use illegal drugs. Some items may interact with your medicine. What should I watch for while using this medicine? Visit your doctor or health care professional for regular checks on your progress. You will need a regular breast and pelvic exam and Pap smear while on this medicine. Use an additional method of contraception during the first cycle that you use this patch. If you have any reason to think you are pregnant, stop using this medicine right away and contact your doctor or health care professional. If you are using this medicine for hormone related problems, it may take several cycles of use to see improvement in your condition. Smoking increases the risk of getting a blood clot or having a stroke while you are using hormonal birth control, especially if you are more than 26 years old. You are strongly advised not to smoke. This medicine can make your body retain fluid, making your fingers, hands, or ankles swell. Your blood pressure can go up. Contact your doctor or health care professional if you feel you are retaining fluid. This medicine can make you more sensitive to the sun. Keep out of the sun. If you cannot  avoid being in the sun, wear protective clothing and use sunscreen. Do not use sun lamps or tanning beds/booths. If you wear contact lenses and notice visual changes, or if the lenses begin to feel uncomfortable, consult your eye care specialist. In some women, tenderness, swelling, or minor bleeding of the gums may occur. Notify your dentist if this happens. Brushing and flossing your teeth regularly may help limit this. See your dentist regularly and inform your dentist of the medicines you are taking. If you are going to have elective surgery or a MRI, you may need to stop using this medicine before the surgery or MRI. Consult your health care professional for advice. This medicine does not protect you against HIV infection (AIDS) or any other sexually transmitted diseases. What side effects may I notice from receiving this medicine? Side effects that you should report to your doctor or health care professional as soon as possible:  allergic reactions such as skin rash or itching, hives, swelling of the lips, mouth, tongue, or throat  breast tissue changes or discharge  dark patches of skin on your forehead, cheeks, upper lip, and chin  depression  high blood pressure  migraines or severe, sudden headaches  missed menstrual periods  signs and symptoms of a blood clot such as breathing problems; changes in vision; chest pain; severe, sudden headache; pain, swelling, warmth in the leg; trouble speaking; sudden numbness or weakness of the face,  arm or leg  skin reactions at the patch site such as blistering, bleeding, itching, rash, or swelling  stomach pain  yellowing of the eyes or skin Side effects that usually do not require medical attention (report these to your doctor or health care professional if they continue or are bothersome):  breast tenderness  irregular vaginal bleeding or spotting, particularly during the first 3 months of use  headache  nausea  painful  menstrual periods  skin redness or mild irritation at site where applied  weight gain (slight) This list may not describe all possible side effects. Call your doctor for medical advice about side effects. You may report side effects to FDA at 1-800-FDA-1088. Where should I keep my medicine? Keep out of the reach of children. Store at room temperature between 15 and 30 degrees C (59 and 86 degrees F). Keep the patch in its pouch until time of use. Throw away any unused medicine after the expiration date. Dispose of used patches properly. Since a used patch may still contain active hormones, fold the patch in half so that it sticks to itself prior to disposal. Throw away in a place where children or pets cannot reach. NOTE: This sheet is a summary. It may not cover all possible information. If you have questions about this medicine, talk to your doctor, pharmacist, or health care provider.  2020 Elsevier/Gold Standard (2019-01-30 11:56:29)

## 2020-08-06 NOTE — Assessment & Plan Note (Signed)
-  Start ortho-evra -Handout on specific birth control in AVS -Follow up PRN

## 2020-08-06 NOTE — Progress Notes (Signed)
    SUBJECTIVE:   CHIEF COMPLAINT / HPI:   Anne Vaughn is a 24 yo F who presents for the issue below.   Birth control Would like to start the patch. Has tried the patch prior. Cannot remember specific side effects. LMP 07/30/20. Is sexually active and uses condoms for protection. Not currently using any other form of birth control. Prefers the patch but open to other options if the patch does not work this time around. Denies personal or family history of VTE.   PERTINENT  PMH / PSH: As above.   OBJECTIVE:   BP 105/80   Pulse 87   Ht 5' 7.32" (1.71 m)   Wt 132 lb (59.9 kg)   SpO2 97%   BMI 20.48 kg/m   General: Appears well, no acute distress. Age appropriate. PHQ9 SCORE ONLY 08/06/2020 11/23/2019 01/29/2019  PHQ-9 Total Score 0 0 9   ASSESSMENT/PLAN:   Encounter for initial prescription of transdermal patch hormonal contraceptive device -Start ortho-evra -Handout on specific birth control in AVS -Follow up PRN     Lavonda Jumbo, DO [facility]

## 2020-08-25 ENCOUNTER — Ambulatory Visit: Payer: Medicaid Other | Admitting: Family Medicine

## 2020-10-07 ENCOUNTER — Other Ambulatory Visit: Payer: Self-pay

## 2020-10-07 ENCOUNTER — Other Ambulatory Visit (HOSPITAL_COMMUNITY)
Admission: RE | Admit: 2020-10-07 | Discharge: 2020-10-07 | Disposition: A | Discharge status: Home / Self Care | Payer: Medicaid Other | Source: Ambulatory Visit | Attending: Family Medicine | Admitting: Family Medicine

## 2020-10-07 ENCOUNTER — Ambulatory Visit (INDEPENDENT_AMBULATORY_CARE_PROVIDER_SITE_OTHER): Payer: Medicaid Other | Admitting: Family Medicine

## 2020-10-07 ENCOUNTER — Encounter: Payer: Self-pay | Admitting: Family Medicine

## 2020-10-07 VITALS — BP 116/78 | HR 80 | Ht 67.32 in | Wt 129.0 lb

## 2020-10-07 DIAGNOSIS — Z113 Encounter for screening for infections with a predominantly sexual mode of transmission: Secondary | ICD-10-CM

## 2020-10-07 DIAGNOSIS — R3915 Urgency of urination: Secondary | ICD-10-CM

## 2020-10-07 LAB — POCT URINALYSIS DIP (MANUAL ENTRY)
Bilirubin, UA: NEGATIVE
Blood, UA: NEGATIVE
Glucose, UA: NEGATIVE mg/dL
Ketones, POC UA: NEGATIVE mg/dL
Nitrite, UA: NEGATIVE
Protein Ur, POC: NEGATIVE mg/dL
Spec Grav, UA: >=1.03 — AB (ref 1.010–1.025)
Urobilinogen, UA: 0.2 E.U./dL
pH, UA: 5.5 (ref 5.0–8.0)

## 2020-10-07 LAB — POCT WET PREP (WET MOUNT)
Clue Cells Wet Prep Whiff POC: POSITIVE
Trichomonas Wet Prep HPF POC: ABSENT

## 2020-10-07 LAB — POCT URINE PREGNANCY: Preg Test, Ur: NEGATIVE

## 2020-10-07 LAB — POCT UA - MICROSCOPIC ONLY

## 2020-10-07 NOTE — Assessment & Plan Note (Signed)
Gonorrhea/chlamydia RPR Wet prep HIV Urine pregnancy test negative today

## 2020-10-07 NOTE — Assessment & Plan Note (Signed)
Urine analysis

## 2020-10-07 NOTE — Patient Instructions (Signed)
I will follow up with you regarding your results once they are available.  In the meantime, I recommend making time to empty your bladder regularly so as to try and prevent any overflow.   Urinary Frequency, Adult Urinary frequency means urinating more often than usual. You may urinate every 1-2 hours even though you drink a normal amount of fluid and do not have a bladder infection or condition. Although you urinate more often than normal, the total amount of urine produced in a day is normal. With urinary frequency, you may have an urgent need to urinate often. The stress and anxiety of needing to find a bathroom quickly can make this urge worse. This condition may go away on its own or you may need treatment at home. Home treatment may include bladder training, exercises, taking medicines, or making changes to your diet. Follow these instructions at home: Bladder health   Keep a bladder diary if told by your health care provider. Keep track of: ? What you eat and drink. ? How often you urinate. ? How much you urinate.  Follow a bladder training program if told by your health care provider. This may include: ? Learning to delay going to the bathroom. ? Double urinating (voiding). This helps if you are not completely emptying your bladder. ? Scheduled voiding.  Do Kegel exercises as told by your health care provider. Kegel exercises strengthen the muscles that help control urination, which may help the condition. Eating and drinking  If told by your health care provider, make diet changes, such as: ? Avoiding caffeine. ? Drinking fewer fluids, especially alcohol. ? Not drinking in the evening. ? Avoiding foods or drinks that may irritate the bladder. These include coffee, tea, soda, artificial sweeteners, citrus, tomato-based foods, and chocolate. ? Eating foods that help prevent or ease constipation. Constipation can make this condition worse. Your health care provider may recommend  that you:  Drink enough fluid to keep your urine pale yellow.  Take over-the-counter or prescription medicines.  Eat foods that are high in fiber, such as beans, whole grains, and fresh fruits and vegetables.  Limit foods that are high in fat and processed sugars, such as fried or sweet foods. General instructions  Take over-the-counter and prescription medicines only as told by your health care provider.  Keep all follow-up visits as told by your health care provider. This is important. Contact a health care provider if:  You start urinating more often.  You feel pain or irritation when you urinate.  You notice blood in your urine.  Your urine looks cloudy.  You develop a fever.  You begin vomiting. Get help right away if:  You are unable to urinate. Summary  Urinary frequency means urinating more often than usual. With urinary frequency, you may urinate every 1-2 hours even though you drink a normal amount of fluid and do not have a bladder infection or other bladder condition.  Your health care provider may recommend that you keep a bladder diary, follow a bladder training program, or make dietary changes.  If told by your health care provider, do Kegel exercises to strengthen the muscles that help control urination.  Take over-the-counter and prescription medicines only as told by your health care provider.  Contact a health care provider if your symptoms do not improve or get worse. This information is not intended to replace advice given to you by your health care provider. Make sure you discuss any questions you have with your health  care provider. Document Revised: 05/04/2018 Document Reviewed: 05/04/2018 Elsevier Patient Education  2020 [employer].

## 2020-10-07 NOTE — Progress Notes (Signed)
    SUBJECTIVE:   CHIEF COMPLAINT / HPI: STI testing and "weak bladder"  Urinary Symptoms Patient reports that she has been having symptoms of a "weak bladder" for the last two weeks.  She reports that she cannot hold her urine as much as she used to.  She denies any loss of urine but states that she must go quickly when she has sensation to urinate.  She denies any abnormal bleeding, states her last menstrual.  Was on 09/20/2020.  She denies any hematuria or abnormal color/odor to her urine.  Patient denies any back pain or abdominal pain nor any fevers or chills.   Desire for STI testing Patient states that she was last sexually active with a new partner 2 months ago.  She denies any symptoms including vaginal discharge or irritation.  She denies report of any symptoms from her partner.  Patient uses a hormonal contraception patch.  Last.  With 09/20/2020  PERTINENT  PMH / PSH:   Bacterial vaginosis  OBJECTIVE:   BP 116/78   Pulse 80   Ht 5' 7.32" (1.71 m)   Wt 129 lb (58.5 kg)   LMP 09/20/2020   SpO2 100%   BMI 20.01 kg/m   General: Female appearing stated age, in no acute distress Abdominal exam: Abdomen is flat, no distention, some abdominal striae secondary to previous pregnancies, no tenderness to palpation, bowel sounds present in all 4 quadrants, no CVA tenderness Genitalia:  Normal introitus for age, no external lesions, copious white/watery vaginal discharge, mucosa pink and moist, no vaginal lesions, mild cervical irritation superior to external cervical os, no vaginal atrophy, no friaility or hemorrhage, normal uterus size and position, no adnexal masses or tenderness  ASSESSMENT/PLAN:   Urinary urgency Urine analysis   Encounter for screening examination for sexually transmitted disease Gonorrhea/chlamydia RPR Wet prep HIV Urine pregnancy test negative today    Ronnald Ramp, MD [facility]

## 2020-10-08 ENCOUNTER — Other Ambulatory Visit: Payer: Self-pay | Admitting: Family Medicine

## 2020-10-08 LAB — CERVICOVAGINAL ANCILLARY ONLY
Chlamydia: NEGATIVE
Comment: NEGATIVE
Comment: NORMAL
Neisseria Gonorrhea: NEGATIVE

## 2020-10-08 LAB — HIV ANTIBODY (ROUTINE TESTING W REFLEX): HIV Screen 4th Generation wRfx: NONREACTIVE

## 2020-10-08 LAB — RPR: RPR Ser Ql: NONREACTIVE

## 2020-10-08 MED ORDER — FLUCONAZOLE 150 MG PO TABS: 150.0000 mg | ORAL_TABLET | Freq: Once | ORAL | 0 refills | Status: AC

## 2020-10-08 MED ORDER — METRONIDAZOLE 500 MG PO TABS: 500.0000 mg | ORAL_TABLET | Freq: Three times a day (TID) | ORAL | 0 refills | Status: AC

## 2020-10-08 NOTE — Progress Notes (Signed)
Patient's wet prep consistent with BV and candidiasis. Rx sent for diflucan and metronidazole.

## 2020-10-09 ENCOUNTER — Encounter: Payer: Self-pay | Admitting: Family Medicine

## 2021-02-19 ENCOUNTER — Other Ambulatory Visit: Payer: Self-pay

## 2021-02-19 DIAGNOSIS — Z30016 Encounter for initial prescription of transdermal patch hormonal contraceptive device: Secondary | ICD-10-CM

## 2021-02-19 MED ORDER — NORELGESTROMIN-ETH ESTRADIOL 150-35 MCG/24HR TD PTWK: 1.0000 | MEDICATED_PATCH | TRANSDERMAL | 12 refills | Status: AC

## 2021-03-08 DIAGNOSIS — Z419 Encounter for procedure for purposes other than remedying health state, unspecified: Secondary | ICD-10-CM | POA: Diagnosis not present

## 2021-04-08 DIAGNOSIS — Z419 Encounter for procedure for purposes other than remedying health state, unspecified: Secondary | ICD-10-CM | POA: Diagnosis not present

## 2021-05-08 DIAGNOSIS — Z419 Encounter for procedure for purposes other than remedying health state, unspecified: Secondary | ICD-10-CM | POA: Diagnosis not present

## 2021-06-08 DIAGNOSIS — Z419 Encounter for procedure for purposes other than remedying health state, unspecified: Secondary | ICD-10-CM | POA: Diagnosis not present

## 2021-07-09 DIAGNOSIS — Z419 Encounter for procedure for purposes other than remedying health state, unspecified: Secondary | ICD-10-CM | POA: Diagnosis not present

## 2021-08-05 DIAGNOSIS — R399 Unspecified symptoms and signs involving the genitourinary system: Secondary | ICD-10-CM | POA: Diagnosis not present

## 2021-08-05 DIAGNOSIS — B9689 Other specified bacterial agents as the cause of diseases classified elsewhere: Secondary | ICD-10-CM | POA: Diagnosis not present

## 2021-08-05 DIAGNOSIS — B373 Candidiasis of vulva and vagina: Secondary | ICD-10-CM | POA: Diagnosis not present

## 2021-08-05 DIAGNOSIS — N898 Other specified noninflammatory disorders of vagina: Secondary | ICD-10-CM | POA: Diagnosis not present

## 2021-08-05 DIAGNOSIS — N760 Acute vaginitis: Secondary | ICD-10-CM | POA: Diagnosis not present

## 2021-08-08 DIAGNOSIS — Z419 Encounter for procedure for purposes other than remedying health state, unspecified: Secondary | ICD-10-CM | POA: Diagnosis not present

## 2021-08-25 DIAGNOSIS — A64 Unspecified sexually transmitted disease: Secondary | ICD-10-CM | POA: Diagnosis not present

## 2021-09-08 DIAGNOSIS — Z419 Encounter for procedure for purposes other than remedying health state, unspecified: Secondary | ICD-10-CM | POA: Diagnosis not present

## 2021-09-16 DIAGNOSIS — Z9189 Other specified personal risk factors, not elsewhere classified: Secondary | ICD-10-CM | POA: Diagnosis not present

## 2021-09-16 DIAGNOSIS — A64 Unspecified sexually transmitted disease: Secondary | ICD-10-CM | POA: Diagnosis not present

## 2021-10-08 DIAGNOSIS — Z419 Encounter for procedure for purposes other than remedying health state, unspecified: Secondary | ICD-10-CM | POA: Diagnosis not present

## 2021-11-08 DIAGNOSIS — Z419 Encounter for procedure for purposes other than remedying health state, unspecified: Secondary | ICD-10-CM | POA: Diagnosis not present

## 2021-12-09 DIAGNOSIS — Z419 Encounter for procedure for purposes other than remedying health state, unspecified: Secondary | ICD-10-CM | POA: Diagnosis not present

## 2021-12-19 DIAGNOSIS — R32 Unspecified urinary incontinence: Secondary | ICD-10-CM | POA: Diagnosis not present

## 2021-12-19 DIAGNOSIS — Z113 Encounter for screening for infections with a predominantly sexual mode of transmission: Secondary | ICD-10-CM | POA: Diagnosis not present

## 2021-12-19 DIAGNOSIS — N390 Urinary tract infection, site not specified: Secondary | ICD-10-CM | POA: Diagnosis not present

## 2021-12-19 DIAGNOSIS — L709 Acne, unspecified: Secondary | ICD-10-CM | POA: Diagnosis not present

## 2022-01-06 DIAGNOSIS — Z419 Encounter for procedure for purposes other than remedying health state, unspecified: Secondary | ICD-10-CM | POA: Diagnosis not present

## 2022-02-06 DIAGNOSIS — Z419 Encounter for procedure for purposes other than remedying health state, unspecified: Secondary | ICD-10-CM | POA: Diagnosis not present

## 2022-03-08 DIAGNOSIS — Z419 Encounter for procedure for purposes other than remedying health state, unspecified: Secondary | ICD-10-CM | POA: Diagnosis not present

## 2022-03-24 DIAGNOSIS — Z113 Encounter for screening for infections with a predominantly sexual mode of transmission: Secondary | ICD-10-CM | POA: Diagnosis not present

## 2022-03-24 DIAGNOSIS — Z202 Contact with and (suspected) exposure to infections with a predominantly sexual mode of transmission: Secondary | ICD-10-CM | POA: Diagnosis not present

## 2022-04-08 DIAGNOSIS — Z419 Encounter for procedure for purposes other than remedying health state, unspecified: Secondary | ICD-10-CM | POA: Diagnosis not present

## 2022-04-13 ENCOUNTER — Encounter: Payer: Self-pay | Admitting: *Deleted

## 2022-05-08 DIAGNOSIS — Z419 Encounter for procedure for purposes other than remedying health state, unspecified: Secondary | ICD-10-CM | POA: Diagnosis not present

## 2022-06-08 DIAGNOSIS — Z419 Encounter for procedure for purposes other than remedying health state, unspecified: Secondary | ICD-10-CM | POA: Diagnosis not present

## 2022-06-28 DIAGNOSIS — Z3A01 Less than 8 weeks gestation of pregnancy: Secondary | ICD-10-CM | POA: Diagnosis not present

## 2022-06-28 DIAGNOSIS — O209 Hemorrhage in early pregnancy, unspecified: Secondary | ICD-10-CM | POA: Diagnosis not present

## 2022-06-28 DIAGNOSIS — O99891 Other specified diseases and conditions complicating pregnancy: Secondary | ICD-10-CM | POA: Diagnosis not present

## 2022-06-28 DIAGNOSIS — N838 Other noninflammatory disorders of ovary, fallopian tube and broad ligament: Secondary | ICD-10-CM | POA: Diagnosis not present

## 2022-07-05 DIAGNOSIS — Z3A01 Less than 8 weeks gestation of pregnancy: Secondary | ICD-10-CM | POA: Diagnosis not present

## 2022-07-05 DIAGNOSIS — O208 Other hemorrhage in early pregnancy: Secondary | ICD-10-CM | POA: Diagnosis not present

## 2022-07-09 DIAGNOSIS — Z3A01 Less than 8 weeks gestation of pregnancy: Secondary | ICD-10-CM | POA: Diagnosis not present

## 2022-07-09 DIAGNOSIS — O418X10 Other specified disorders of amniotic fluid and membranes, first trimester, not applicable or unspecified: Secondary | ICD-10-CM | POA: Diagnosis not present

## 2022-07-09 DIAGNOSIS — Z3401 Encounter for supervision of normal first pregnancy, first trimester: Secondary | ICD-10-CM | POA: Diagnosis not present

## 2022-07-09 DIAGNOSIS — O468X1 Other antepartum hemorrhage, first trimester: Secondary | ICD-10-CM | POA: Diagnosis not present

## 2022-07-09 DIAGNOSIS — Z3490 Encounter for supervision of normal pregnancy, unspecified, unspecified trimester: Secondary | ICD-10-CM | POA: Diagnosis not present

## 2022-07-09 DIAGNOSIS — Z419 Encounter for procedure for purposes other than remedying health state, unspecified: Secondary | ICD-10-CM | POA: Diagnosis not present

## 2022-08-08 DIAGNOSIS — Z419 Encounter for procedure for purposes other than remedying health state, unspecified: Secondary | ICD-10-CM | POA: Diagnosis not present

## 2022-08-11 DIAGNOSIS — Z3A11 11 weeks gestation of pregnancy: Secondary | ICD-10-CM | POA: Diagnosis not present

## 2022-08-11 DIAGNOSIS — Z1389 Encounter for screening for other disorder: Secondary | ICD-10-CM | POA: Diagnosis not present

## 2022-08-11 DIAGNOSIS — Z3491 Encounter for supervision of normal pregnancy, unspecified, first trimester: Secondary | ICD-10-CM | POA: Diagnosis not present

## 2022-08-11 DIAGNOSIS — O3680X0 Pregnancy with inconclusive fetal viability, not applicable or unspecified: Secondary | ICD-10-CM | POA: Diagnosis not present

## 2022-09-08 DIAGNOSIS — Z419 Encounter for procedure for purposes other than remedying health state, unspecified: Secondary | ICD-10-CM | POA: Diagnosis not present

## 2022-10-08 DIAGNOSIS — Z419 Encounter for procedure for purposes other than remedying health state, unspecified: Secondary | ICD-10-CM | POA: Diagnosis not present

## 2022-10-24 DIAGNOSIS — U071 COVID-19: Secondary | ICD-10-CM | POA: Diagnosis not present

## 2022-10-24 DIAGNOSIS — O99891 Other specified diseases and conditions complicating pregnancy: Secondary | ICD-10-CM | POA: Diagnosis not present

## 2022-10-24 DIAGNOSIS — Z3A22 22 weeks gestation of pregnancy: Secondary | ICD-10-CM | POA: Diagnosis not present

## 2022-10-24 DIAGNOSIS — R519 Headache, unspecified: Secondary | ICD-10-CM | POA: Diagnosis not present

## 2022-10-24 DIAGNOSIS — O98512 Other viral diseases complicating pregnancy, second trimester: Secondary | ICD-10-CM | POA: Diagnosis not present

## 2022-10-26 DIAGNOSIS — Z3A22 22 weeks gestation of pregnancy: Secondary | ICD-10-CM | POA: Diagnosis not present

## 2022-10-26 DIAGNOSIS — Z363 Encounter for antenatal screening for malformations: Secondary | ICD-10-CM | POA: Diagnosis not present

## 2022-11-08 DIAGNOSIS — Z419 Encounter for procedure for purposes other than remedying health state, unspecified: Secondary | ICD-10-CM | POA: Diagnosis not present

## 2022-12-09 DIAGNOSIS — Z419 Encounter for procedure for purposes other than remedying health state, unspecified: Secondary | ICD-10-CM | POA: Diagnosis not present

## 2022-12-14 DIAGNOSIS — Z3482 Encounter for supervision of other normal pregnancy, second trimester: Secondary | ICD-10-CM | POA: Diagnosis not present

## 2022-12-14 DIAGNOSIS — Z23 Encounter for immunization: Secondary | ICD-10-CM | POA: Diagnosis not present

## 2023-01-04 DIAGNOSIS — Z3482 Encounter for supervision of other normal pregnancy, second trimester: Secondary | ICD-10-CM | POA: Diagnosis not present

## 2023-01-04 DIAGNOSIS — Z3483 Encounter for supervision of other normal pregnancy, third trimester: Secondary | ICD-10-CM | POA: Diagnosis not present

## 2023-01-07 DIAGNOSIS — Z419 Encounter for procedure for purposes other than remedying health state, unspecified: Secondary | ICD-10-CM | POA: Diagnosis not present

## 2023-02-03 DIAGNOSIS — Z113 Encounter for screening for infections with a predominantly sexual mode of transmission: Secondary | ICD-10-CM | POA: Diagnosis not present

## 2023-02-03 DIAGNOSIS — O23599 Infection of other part of genital tract in pregnancy, unspecified trimester: Secondary | ICD-10-CM | POA: Diagnosis not present

## 2023-02-03 DIAGNOSIS — N760 Acute vaginitis: Secondary | ICD-10-CM | POA: Diagnosis not present

## 2023-02-03 DIAGNOSIS — Z3480 Encounter for supervision of other normal pregnancy, unspecified trimester: Secondary | ICD-10-CM | POA: Diagnosis not present

## 2023-02-07 DIAGNOSIS — Z419 Encounter for procedure for purposes other than remedying health state, unspecified: Secondary | ICD-10-CM | POA: Diagnosis not present

## 2023-02-22 DIAGNOSIS — Z3A39 39 weeks gestation of pregnancy: Secondary | ICD-10-CM | POA: Diagnosis not present

## 2023-02-22 DIAGNOSIS — O99824 Streptococcus B carrier state complicating childbirth: Secondary | ICD-10-CM | POA: Diagnosis not present

## 2023-02-22 DIAGNOSIS — O4202 Full-term premature rupture of membranes, onset of labor within 24 hours of rupture: Secondary | ICD-10-CM | POA: Diagnosis not present

## 2023-02-22 DIAGNOSIS — Z3482 Encounter for supervision of other normal pregnancy, second trimester: Secondary | ICD-10-CM | POA: Diagnosis not present

## 2023-02-23 DIAGNOSIS — Z3A39 39 weeks gestation of pregnancy: Secondary | ICD-10-CM | POA: Diagnosis not present

## 2023-02-23 DIAGNOSIS — O99824 Streptococcus B carrier state complicating childbirth: Secondary | ICD-10-CM | POA: Diagnosis not present

## 2023-02-23 DIAGNOSIS — B951 Streptococcus, group B, as the cause of diseases classified elsewhere: Secondary | ICD-10-CM | POA: Diagnosis not present

## 2023-02-23 DIAGNOSIS — O4202 Full-term premature rupture of membranes, onset of labor within 24 hours of rupture: Secondary | ICD-10-CM | POA: Diagnosis not present

## 2023-03-09 DIAGNOSIS — Z419 Encounter for procedure for purposes other than remedying health state, unspecified: Secondary | ICD-10-CM | POA: Diagnosis not present

## 2023-04-09 DIAGNOSIS — Z419 Encounter for procedure for purposes other than remedying health state, unspecified: Secondary | ICD-10-CM | POA: Diagnosis not present

## 2023-05-09 DIAGNOSIS — Z419 Encounter for procedure for purposes other than remedying health state, unspecified: Secondary | ICD-10-CM | POA: Diagnosis not present

## 2023-06-09 DIAGNOSIS — Z419 Encounter for procedure for purposes other than remedying health state, unspecified: Secondary | ICD-10-CM | POA: Diagnosis not present

## 2023-06-29 DIAGNOSIS — Z30016 Encounter for initial prescription of transdermal patch hormonal contraceptive device: Secondary | ICD-10-CM | POA: Diagnosis not present

## 2023-07-10 DIAGNOSIS — Z419 Encounter for procedure for purposes other than remedying health state, unspecified: Secondary | ICD-10-CM | POA: Diagnosis not present

## 2023-07-30 DIAGNOSIS — S39012A Strain of muscle, fascia and tendon of lower back, initial encounter: Secondary | ICD-10-CM | POA: Diagnosis not present

## 2023-08-04 DIAGNOSIS — Z113 Encounter for screening for infections with a predominantly sexual mode of transmission: Secondary | ICD-10-CM | POA: Diagnosis not present

## 2023-08-04 DIAGNOSIS — Z114 Encounter for screening for human immunodeficiency virus [HIV]: Secondary | ICD-10-CM | POA: Diagnosis not present

## 2023-08-09 DIAGNOSIS — Z419 Encounter for procedure for purposes other than remedying health state, unspecified: Secondary | ICD-10-CM | POA: Diagnosis not present

## 2023-09-09 DIAGNOSIS — Z419 Encounter for procedure for purposes other than remedying health state, unspecified: Secondary | ICD-10-CM | POA: Diagnosis not present

## 2023-10-09 DIAGNOSIS — Z419 Encounter for procedure for purposes other than remedying health state, unspecified: Secondary | ICD-10-CM | POA: Diagnosis not present

## 2023-11-09 DIAGNOSIS — Z419 Encounter for procedure for purposes other than remedying health state, unspecified: Secondary | ICD-10-CM | POA: Diagnosis not present

## 2023-12-08 DIAGNOSIS — Z113 Encounter for screening for infections with a predominantly sexual mode of transmission: Secondary | ICD-10-CM | POA: Diagnosis not present

## 2023-12-10 DIAGNOSIS — Z419 Encounter for procedure for purposes other than remedying health state, unspecified: Secondary | ICD-10-CM | POA: Diagnosis not present

## 2024-01-07 DIAGNOSIS — Z419 Encounter for procedure for purposes other than remedying health state, unspecified: Secondary | ICD-10-CM | POA: Diagnosis not present

## 2024-02-18 DIAGNOSIS — Z419 Encounter for procedure for purposes other than remedying health state, unspecified: Secondary | ICD-10-CM | POA: Diagnosis not present

## 2024-03-19 DIAGNOSIS — Z419 Encounter for procedure for purposes other than remedying health state, unspecified: Secondary | ICD-10-CM | POA: Diagnosis not present

## 2024-04-19 DIAGNOSIS — Z419 Encounter for procedure for purposes other than remedying health state, unspecified: Secondary | ICD-10-CM | POA: Diagnosis not present

## 2024-05-19 DIAGNOSIS — Z419 Encounter for procedure for purposes other than remedying health state, unspecified: Secondary | ICD-10-CM | POA: Diagnosis not present

## 2024-05-29 DIAGNOSIS — R5383 Other fatigue: Secondary | ICD-10-CM | POA: Diagnosis not present

## 2024-05-29 DIAGNOSIS — Z113 Encounter for screening for infections with a predominantly sexual mode of transmission: Secondary | ICD-10-CM | POA: Diagnosis not present

## 2024-05-29 DIAGNOSIS — N898 Other specified noninflammatory disorders of vagina: Secondary | ICD-10-CM | POA: Diagnosis not present

## 2024-06-07 ENCOUNTER — Ambulatory Visit (INDEPENDENT_AMBULATORY_CARE_PROVIDER_SITE_OTHER): Admitting: Family Medicine

## 2024-06-07 ENCOUNTER — Encounter: Payer: Self-pay | Admitting: Family Medicine

## 2024-06-07 VITALS — BP 124/68 | HR 79 | Ht 67.0 in | Wt 117.0 lb

## 2024-06-07 DIAGNOSIS — R1310 Dysphagia, unspecified: Secondary | ICD-10-CM | POA: Diagnosis not present

## 2024-06-07 DIAGNOSIS — E0590 Thyrotoxicosis, unspecified without thyrotoxic crisis or storm: Secondary | ICD-10-CM | POA: Diagnosis not present

## 2024-06-07 NOTE — Progress Notes (Signed)
 New Patient Office Visit  Subjective    Patient ID: Anne Vaughn, female    DOB: 1996-08-14  Age: 28 y.o. MRN: [id number]  CC:  Chief Complaint  Patient presents with   Establish Care         HPI Anne Vaughn presents to establish care and for review of chronic med issue including hyperthyroidism. She reports some intermittent difficulty with swallowing.    Outpatient Encounter Medications as of 06/07/2024  Medication Sig   metroNIDAZOLE  (FLAGYL ) 500 MG tablet Take 1 tablet (500 mg total) by mouth 3 (three) times daily. (Patient not taking: Reported on 06/07/2024)   norelgestromin -ethinyl estradiol  (ORTHO EVRA) 150-35 MCG/24HR transdermal patch Place 1 patch onto the skin once a week. (Patient not taking: Reported on 06/07/2024)   No facility-administered encounter medications on file as of 06/07/2024.    Past Medical History:  Diagnosis Date   Anemia     Past Surgical History:  Procedure Laterality Date   NO PAST SURGERIES      Family History  Problem Relation Age of Onset   Hypertension Mother    Mental illness Father    Cancer Paternal Grandmother     Social History   Socioeconomic History   Marital status: Not on file    Spouse name: Not on file   Number of children: Not on file   Years of education: Not on file   Highest education level: Not on file  Occupational History   Not on file  Tobacco Use   Smoking status: Never   Smokeless tobacco: Never  Substance and Sexual Activity   Alcohol use: No    Alcohol/week: 0.0 standard drinks of alcohol   Drug use: No   Sexual activity: Yes    Birth control/protection: None  Other Topics Concern   Not on file  Social History Narrative   3 kids - 87 yr old, 76 yr old, 1 yr old    Social Drivers of Corporate investment banker Strain: Not on file  Food Insecurity: No Food Insecurity (07/09/2022)   Received [facility]   Hunger Vital Sign    Within the past 12 months,  you worried that your food would run out before you got the money to buy more.: Never true    Within the past 12 months, the food you bought just didn't last and you didn't have money to get more.: Never true  Transportation Needs: No Transportation Needs (07/09/2022)   Received from [facility] - Transportation    In the past 12 months, has lack of transportation kept you from medical appointments or from getting medications?: No    Lack of Transportation (Non-Medical): No  Physical Activity: Not on file  Stress: Not on file  Social Connections: Not on file  Intimate Partner Violence: Not At Risk (08/11/2022)   Received from [facility]   Humiliation, Afraid, Rape, and Kick questionnaire    Within the last year, have you been afraid of your partner or ex-partner?: No    Within the last year, have you been humiliated or emotionally abused in other ways by your partner or ex-partner?: No    Within the last year, have you been kicked, hit, slapped, or otherwise physically hurt by your partner or ex-partner?: No    Within the last year, have you been raped or forced to have any kind of sexual activity by your partner or ex-partner?: No  Review of Systems  All other systems reviewed and are negative.       Objective   BP 124/68   Pulse 79   Ht 5' 7 (1.702 m)   Wt 117 lb (53.1 kg)   LMP 05/18/2024 (Exact Date)   SpO2 96%   BMI 18.32 kg/m   Physical Exam Vitals and nursing note reviewed.  Constitutional:      General: She is not in acute distress. Neck:     Thyroid : Thyromegaly (left lobe?) present.  Cardiovascular:     Rate and Rhythm: Normal rate and regular rhythm.  Pulmonary:     Effort: Pulmonary effort is normal.     Breath sounds: Normal breath sounds.  Musculoskeletal:     Cervical back: Normal range of motion and neck supple.  Neurological:     General: No focal deficit present.     Mental Status: She is alert and oriented to  person, place, and time.         Assessment & Plan:   Hyperthyroidism -     TSH + free T4  Dysphagia, unspecified type     No follow-ups on file.   Tanda Raguel SQUIBB, MD

## 2024-06-08 LAB — TSH+FREE T4
Free T4: 1.3 ng/dL (ref 0.82–1.77)
TSH: 0.358 u[IU]/mL — ABNORMAL LOW (ref 0.450–4.500)

## 2024-06-11 ENCOUNTER — Encounter: Payer: Self-pay | Admitting: Family Medicine

## 2024-06-11 ENCOUNTER — Telehealth: Payer: Self-pay | Admitting: Emergency Medicine

## 2024-06-11 ENCOUNTER — Telehealth: Payer: Self-pay

## 2024-06-11 ENCOUNTER — Other Ambulatory Visit: Payer: Self-pay | Admitting: Family Medicine

## 2024-06-11 ENCOUNTER — Ambulatory Visit: Payer: Self-pay | Admitting: Family Medicine

## 2024-06-11 DIAGNOSIS — E010 Iodine-deficiency related diffuse (endemic) goiter: Secondary | ICD-10-CM

## 2024-06-11 NOTE — Telephone Encounter (Signed)
 Please review lab results and advise   Copied from CRM #[id number]. Topic: Clinical - Lab/Test Results >> Jun 11, 2024 11:04 AM Berwyn MATSU wrote: Reason for CRM: Patient called in requesting to go over lab results from 06/07/24. Patient also states that she was advised that she would get a full panel lab done.  Patient is requesting is a call back to further discuss.

## 2024-06-11 NOTE — Telephone Encounter (Signed)
 I made patient aware that it takes up to two weeks for the referral place to call patient.  If she has not heard anything in two weeks to give us  a call back.

## 2024-06-12 ENCOUNTER — Ambulatory Visit
Admission: RE | Admit: 2024-06-12 | Discharge: 2024-06-12 | Disposition: A | Discharge status: Home / Self Care | Source: Ambulatory Visit | Attending: Family Medicine | Admitting: Family Medicine

## 2024-06-12 DIAGNOSIS — E010 Iodine-deficiency related diffuse (endemic) goiter: Secondary | ICD-10-CM

## 2024-06-12 DIAGNOSIS — E049 Nontoxic goiter, unspecified: Secondary | ICD-10-CM | POA: Diagnosis not present

## 2024-06-14 ENCOUNTER — Ambulatory Visit: Payer: Self-pay | Admitting: Family Medicine

## 2024-06-14 ENCOUNTER — Telehealth: Payer: Self-pay | Admitting: Family Medicine

## 2024-06-14 NOTE — Telephone Encounter (Signed)
 A document form has been faxed: Service Order for Medical Necessity, to be filled out by provider. Send document back via Fax within 7-days. Document is located in providers tray at front office.          Fax number: [phone]

## 2024-06-15 ENCOUNTER — Telehealth: Payer: Self-pay

## 2024-06-15 NOTE — Telephone Encounter (Signed)
 Called pt and gave results. Pt was appreciative and understanding   Copied from CRM [id number]. Topic: Clinical - Lab/Test Results >> Jun 15, 2024 11:15 AM Tobias CROME wrote: Reason for CRM: Patient calling for ultrasound results.  Patient requesting callback, [phone].

## 2024-06-19 DIAGNOSIS — Z419 Encounter for procedure for purposes other than remedying health state, unspecified: Secondary | ICD-10-CM | POA: Diagnosis not present

## 2024-06-19 NOTE — Telephone Encounter (Signed)
 Form was sent to fax number provided on 06/15/24

## 2024-07-20 DIAGNOSIS — Z419 Encounter for procedure for purposes other than remedying health state, unspecified: Secondary | ICD-10-CM | POA: Diagnosis not present
